# Patient Record
Sex: Male | Born: 1960 | Race: White | Hispanic: No | Marital: Single | Smoking: Current every day smoker
Health system: Southern US, Community
[De-identification: ages and names within clinical notes are randomized; demographics above are authoritative.]

## PROBLEM LIST (undated history)

## (undated) DIAGNOSIS — F329 Major depressive disorder, single episode, unspecified: Secondary | ICD-10-CM

## (undated) DIAGNOSIS — F17200 Nicotine dependence, unspecified, uncomplicated: Secondary | ICD-10-CM

## (undated) DIAGNOSIS — M1990 Unspecified osteoarthritis, unspecified site: Secondary | ICD-10-CM

## (undated) DIAGNOSIS — R0600 Dyspnea, unspecified: Secondary | ICD-10-CM

## (undated) DIAGNOSIS — Z8614 Personal history of Methicillin resistant Staphylococcus aureus infection: Secondary | ICD-10-CM

## (undated) DIAGNOSIS — F32A Depression, unspecified: Secondary | ICD-10-CM

## (undated) DIAGNOSIS — I10 Essential (primary) hypertension: Secondary | ICD-10-CM

## (undated) HISTORY — PX: TONSILLECTOMY: SUR1361

---
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
## 1997-02-03 HISTORY — PX: ANTERIOR CRUCIATE LIGAMENT REPAIR: SHX115

## 2004-07-23 ENCOUNTER — Ambulatory Visit: Payer: Self-pay

## 2004-08-19 ENCOUNTER — Ambulatory Visit: Payer: Self-pay | Admitting: Orthopaedic Surgery

## 2006-10-19 ENCOUNTER — Ambulatory Visit: Payer: Self-pay | Admitting: Otolaryngology

## 2007-07-15 ENCOUNTER — Emergency Department: Payer: Self-pay | Admitting: Emergency Medicine

## 2010-05-17 ENCOUNTER — Ambulatory Visit: Payer: Self-pay | Admitting: Gastroenterology

## 2012-03-24 ENCOUNTER — Ambulatory Visit: Payer: Self-pay | Admitting: Unknown Physician Specialty

## 2012-04-23 ENCOUNTER — Ambulatory Visit: Payer: Self-pay | Admitting: Unknown Physician Specialty

## 2013-09-16 DIAGNOSIS — F32A Depression, unspecified: Secondary | ICD-10-CM | POA: Insufficient documentation

## 2013-09-16 DIAGNOSIS — J302 Other seasonal allergic rhinitis: Secondary | ICD-10-CM | POA: Insufficient documentation

## 2013-09-16 DIAGNOSIS — F419 Anxiety disorder, unspecified: Secondary | ICD-10-CM | POA: Insufficient documentation

## 2014-02-08 DIAGNOSIS — E785 Hyperlipidemia, unspecified: Secondary | ICD-10-CM | POA: Insufficient documentation

## 2016-01-15 ENCOUNTER — Other Ambulatory Visit: Payer: Self-pay | Admitting: Otolaryngology

## 2016-01-15 DIAGNOSIS — H90A21 Sensorineural hearing loss, unilateral, right ear, with restricted hearing on the contralateral side: Secondary | ICD-10-CM

## 2016-01-30 ENCOUNTER — Ambulatory Visit
Admission: RE | Admit: 2016-01-30 | Discharge: 2016-01-30 | Disposition: A | Discharge status: Home / Self Care | Payer: Managed Care, Other (non HMO) | Source: Ambulatory Visit | Attending: Otolaryngology | Admitting: Otolaryngology

## 2016-01-30 DIAGNOSIS — Z8673 Personal history of transient ischemic attack (TIA), and cerebral infarction without residual deficits: Secondary | ICD-10-CM | POA: Insufficient documentation

## 2016-01-30 DIAGNOSIS — H90A21 Sensorineural hearing loss, unilateral, right ear, with restricted hearing on the contralateral side: Secondary | ICD-10-CM | POA: Diagnosis not present

## 2016-01-30 MED ORDER — GADOBENATE DIMEGLUMINE 529 MG/ML IV SOLN
17.0000 mL | Freq: Once | INTRAVENOUS | Status: AC | PRN
  Administered 2016-01-30: 17 mL via INTRAVENOUS

## 2016-02-04 HISTORY — PX: OTHER SURGICAL HISTORY: SHX169

## 2017-01-06 ENCOUNTER — Other Ambulatory Visit: Payer: Self-pay | Admitting: Podiatry

## 2017-01-06 ENCOUNTER — Encounter
Admission: RE | Admit: 2017-01-06 | Discharge: 2017-01-06 | Disposition: A | Discharge status: Home / Self Care | Payer: 59 | Source: Ambulatory Visit | Attending: Podiatry | Admitting: Podiatry

## 2017-01-06 ENCOUNTER — Other Ambulatory Visit: Payer: Self-pay

## 2017-01-06 DIAGNOSIS — F17210 Nicotine dependence, cigarettes, uncomplicated: Secondary | ICD-10-CM | POA: Diagnosis not present

## 2017-01-06 DIAGNOSIS — X58XXXA Exposure to other specified factors, initial encounter: Secondary | ICD-10-CM | POA: Diagnosis not present

## 2017-01-06 DIAGNOSIS — S96812A Strain of other specified muscles and tendons at ankle and foot level, left foot, initial encounter: Secondary | ICD-10-CM | POA: Diagnosis not present

## 2017-01-06 HISTORY — DX: Unspecified osteoarthritis, unspecified site: M19.90

## 2017-01-06 LAB — SURGICAL PCR SCREEN
MRSA, PCR: NEGATIVE
Staphylococcus aureus: POSITIVE — AB

## 2017-01-06 NOTE — Pre-Procedure Instructions (Signed)
Incentive Spirometry given to pt along with written and verbal instructions, pt returned correct demo of same.

## 2017-01-06 NOTE — Patient Instructions (Signed)
  Your procedure is scheduled ZO:XWRUEAon:Friday Dec. 7th , 2018. Report to Same Day Surgery. To find out your arrival time please call [phone] between 1PM - 3PM on Thursday Dec. 6, 2018 .  Remember: Instructions that are not followed completely may result in serious medical risk, up to and including death, or upon the discretion of your surgeon and anesthesiologist your surgery may need to be rescheduled.    _x___ 1. Do not eat food after midnight night prior to surgery. No gum   chewing or hard candies, snacks or breakfast.    May drink the following: water, Gatorade, clear apple juice, black coffee     or black tea up until 2 hours prior to ARRIVAL time.     ____ 2. No Alcohol for 24 hours before or after surgery.   ____ 3. Bring all medications with you on the day of surgery if instructed.    __x__ 4. Notify your doctor if there is any change in your medical condition     (cold, fever, infections).    _x____ 5.   Do Not Smoke or use e-cigarettes For 24 Hours Prior to Your   Surgery.  Do not use any chewable tobacco products for at least 6   hours prior to  surgery.                      Do not wear jewelry, make-up, hairpins, clips or nail polish.  Do not wear lotions, powders, or perfumes.   Do not shave 48 hours prior to surgery. Men may shave face and neck.  Do not bring valuables to the hospital.    [facility] is not responsible for any belongings or valuables.               Contacts, dentures or bridgework may not be worn into surgery.  Leave your suitcase in the car. After surgery it may be brought to your room.  For patients admitted to the hospital, discharge time is determined by your  treatment team.   Patients discharged the day of surgery will not be allowed to drive home.    Please read over the following fact sheets that you were given:   [facility] Pre[facility]  ____ Take these medicines the morning of surgery with A SIP OF WATER: none      ____  Fleet Enema (as directed)   __x__ Use CHG Soap as directed on instruction sheet  ____ Use inhalers on the day of surgery and bring to hospital day of surgery  ____ Stop metformin 2 days prior to surgery    ____ Take 1/2 of usual insulin dose the night before surgery and none on the morning of          surgery.   ____ Stop Eliquis/Coumadin/Plavix/aspirin on does not apply.  __x__ Stop Anti-inflammatories such as Advil, Aleve, Ibuprofen, Motrin, Naproxen,  Naprosyn, Goodies powders or aspirin products. OK to take Tylenol.   ____ Stop supplements until after surgery.    ____ Bring C-Pap to the hospital.

## 2017-01-06 NOTE — Pre-Procedure Instructions (Signed)
Nasal swab results: MRSA negative, Staph Aureus positive faxed to Dr. Irene LimboFowler's office along with request for H+P.  Status on fax  OK, receptionist reports that the fax was received.

## 2017-01-08 MED ORDER — CEFAZOLIN SODIUM-DEXTROSE 2-4 GM/100ML-% IV SOLN
2.0000 g | INTRAVENOUS | Status: AC
  Administered 2017-01-09: 2 g via INTRAVENOUS

## 2017-01-09 ENCOUNTER — Encounter
Admission: RE | Disposition: A | Discharge status: Home / Self Care | Payer: Self-pay | Source: Ambulatory Visit | Attending: Podiatry

## 2017-01-09 ENCOUNTER — Ambulatory Visit: Payer: 59 | Admitting: Anesthesiology

## 2017-01-09 ENCOUNTER — Encounter: Payer: Self-pay | Admitting: Podiatry

## 2017-01-09 ENCOUNTER — Ambulatory Visit
Admission: RE | Admit: 2017-01-09 | Discharge: 2017-01-09 | Disposition: A | Discharge status: Home / Self Care | Payer: 59 | Source: Ambulatory Visit | Attending: Podiatry | Admitting: Podiatry

## 2017-01-09 DIAGNOSIS — S96812A Strain of other specified muscles and tendons at ankle and foot level, left foot, initial encounter: Secondary | ICD-10-CM | POA: Insufficient documentation

## 2017-01-09 DIAGNOSIS — X58XXXA Exposure to other specified factors, initial encounter: Secondary | ICD-10-CM | POA: Insufficient documentation

## 2017-01-09 DIAGNOSIS — F17210 Nicotine dependence, cigarettes, uncomplicated: Secondary | ICD-10-CM | POA: Insufficient documentation

## 2017-01-09 HISTORY — PX: TENDON REPAIR: SHX5111

## 2017-01-09 LAB — GLUCOSE, CAPILLARY: Glucose-Capillary: 124 mg/dL — ABNORMAL HIGH (ref 65–99)

## 2017-01-09 SURGERY — TENDON REPAIR
Anesthesia: General | Site: Foot | Laterality: Left | Wound class: Clean

## 2017-01-09 MED ORDER — PROPOFOL 500 MG/50ML IV EMUL
INTRAVENOUS | Status: AC
  Filled 2017-01-09: qty 50

## 2017-01-09 MED ORDER — FAMOTIDINE 20 MG PO TABS
ORAL_TABLET | ORAL | Status: AC
  Filled 2017-01-09: qty 1

## 2017-01-09 MED ORDER — OXYCODONE-ACETAMINOPHEN 5-325 MG PO TABS
ORAL_TABLET | ORAL | Status: AC
  Filled 2017-01-09: qty 1

## 2017-01-09 MED ORDER — OXYCODONE-ACETAMINOPHEN 5-325 MG PO TABS
1.0000 | ORAL_TABLET | ORAL | Status: DC | PRN
  Administered 2017-01-09: 1 via ORAL

## 2017-01-09 MED ORDER — BUPIVACAINE HCL (PF) 0.5 % IJ SOLN
INTRAMUSCULAR | Status: AC
  Filled 2017-01-09: qty 30

## 2017-01-09 MED ORDER — ONDANSETRON HCL 4 MG/2ML IJ SOLN: 4.0000 mg | Freq: Once | INTRAMUSCULAR | Status: DC | PRN

## 2017-01-09 MED ORDER — DEXAMETHASONE SODIUM PHOSPHATE 10 MG/ML IJ SOLN
INTRAMUSCULAR | Status: AC
  Filled 2017-01-09: qty 1

## 2017-01-09 MED ORDER — FENTANYL CITRATE (PF) 100 MCG/2ML IJ SOLN
INTRAMUSCULAR | Status: DC | PRN
  Administered 2017-01-09 (×4): 50 ug via INTRAVENOUS

## 2017-01-09 MED ORDER — BUPIVACAINE HCL (PF) 0.25 % IJ SOLN
INTRAMUSCULAR | Status: AC
  Filled 2017-01-09: qty 20

## 2017-01-09 MED ORDER — FENTANYL CITRATE (PF) 100 MCG/2ML IJ SOLN
INTRAMUSCULAR | Status: AC
  Filled 2017-01-09: qty 2

## 2017-01-09 MED ORDER — POVIDONE-IODINE 7.5 % EX SOLN
Freq: Once | CUTANEOUS | Status: DC
  Filled 2017-01-09: qty 118

## 2017-01-09 MED ORDER — LIDOCAINE 2% (20 MG/ML) 5 ML SYRINGE
INTRAMUSCULAR | Status: DC | PRN
  Administered 2017-01-09: 100 mg via INTRAVENOUS

## 2017-01-09 MED ORDER — NEOMYCIN-POLYMYXIN B GU 40-200000 IR SOLN
Status: AC
  Filled 2017-01-09: qty 2

## 2017-01-09 MED ORDER — DEXMEDETOMIDINE HCL 200 MCG/2ML IV SOLN
INTRAVENOUS | Status: DC | PRN
  Administered 2017-01-09: 8 ug via INTRAVENOUS
  Administered 2017-01-09: 4 ug via INTRAVENOUS

## 2017-01-09 MED ORDER — DEXAMETHASONE SODIUM PHOSPHATE 10 MG/ML IJ SOLN
INTRAMUSCULAR | Status: DC | PRN
Start: 2017-01-09 — End: 2017-01-09
  Administered 2017-01-09: 10 mg via INTRAVENOUS

## 2017-01-09 MED ORDER — FAMOTIDINE 20 MG PO TABS
20.0000 mg | ORAL_TABLET | Freq: Once | ORAL | Status: AC
  Administered 2017-01-09: 20 mg via ORAL

## 2017-01-09 MED ORDER — FENTANYL CITRATE (PF) 100 MCG/2ML IJ SOLN
INTRAMUSCULAR | Status: AC
  Administered 2017-01-09: 25 ug via INTRAVENOUS
  Filled 2017-01-09: qty 2

## 2017-01-09 MED ORDER — ONDANSETRON HCL 4 MG/2ML IJ SOLN
INTRAMUSCULAR | Status: DC | PRN
  Administered 2017-01-09: 4 mg via INTRAVENOUS

## 2017-01-09 MED ORDER — PHENYLEPHRINE HCL 10 MG/ML IJ SOLN
INTRAMUSCULAR | Status: DC | PRN
Start: 2017-01-09 — End: 2017-01-09
  Administered 2017-01-09: 100 ug via INTRAVENOUS

## 2017-01-09 MED ORDER — FENTANYL CITRATE (PF) 100 MCG/2ML IJ SOLN
25.0000 ug | INTRAMUSCULAR | Status: DC | PRN
  Administered 2017-01-09 (×2): 25 ug via INTRAVENOUS

## 2017-01-09 MED ORDER — GLYCOPYRROLATE 0.2 MG/ML IJ SOLN
INTRAMUSCULAR | Status: AC
  Filled 2017-01-09: qty 1

## 2017-01-09 MED ORDER — MIDAZOLAM HCL 5 MG/5ML IJ SOLN
INTRAMUSCULAR | Status: DC | PRN
  Administered 2017-01-09: 2 mg via INTRAVENOUS

## 2017-01-09 MED ORDER — BUPIVACAINE LIPOSOME 1.3 % IJ SUSP
INTRAMUSCULAR | Status: AC
  Filled 2017-01-09: qty 20

## 2017-01-09 MED ORDER — BUPIVACAINE HCL 0.25 % IJ SOLN
INTRAMUSCULAR | Status: DC | PRN
  Administered 2017-01-09: 12 mL

## 2017-01-09 MED ORDER — LIDOCAINE HCL (PF) 1 % IJ SOLN
INTRAMUSCULAR | Status: AC
  Filled 2017-01-09: qty 30

## 2017-01-09 MED ORDER — LIDOCAINE HCL (PF) 2 % IJ SOLN
INTRAMUSCULAR | Status: AC
  Filled 2017-01-09: qty 10

## 2017-01-09 MED ORDER — MIDAZOLAM HCL 2 MG/2ML IJ SOLN
INTRAMUSCULAR | Status: AC
  Filled 2017-01-09: qty 2

## 2017-01-09 MED ORDER — LACTATED RINGERS IV SOLN
INTRAVENOUS | Status: DC
  Administered 2017-01-09 (×2): via INTRAVENOUS

## 2017-01-09 MED ORDER — ONDANSETRON HCL 4 MG/2ML IJ SOLN
INTRAMUSCULAR | Status: AC
  Filled 2017-01-09: qty 2

## 2017-01-09 MED ORDER — ALBUTEROL SULFATE HFA 108 (90 BASE) MCG/ACT IN AERS
INHALATION_SPRAY | RESPIRATORY_TRACT | Status: DC | PRN
  Administered 2017-01-09: 4 via RESPIRATORY_TRACT

## 2017-01-09 MED ORDER — CEFAZOLIN SODIUM-DEXTROSE 2-4 GM/100ML-% IV SOLN
INTRAVENOUS | Status: AC
  Filled 2017-01-09: qty 100

## 2017-01-09 MED ORDER — PROPOFOL 10 MG/ML IV BOLUS
INTRAVENOUS | Status: DC | PRN
  Administered 2017-01-09: 100 mg via INTRAVENOUS
  Administered 2017-01-09: 200 mg via INTRAVENOUS

## 2017-01-09 MED ORDER — BUPIVACAINE LIPOSOME 1.3 % IJ SUSP
INTRAMUSCULAR | Status: DC | PRN
  Administered 2017-01-09: 12 mL

## 2017-01-09 MED ORDER — OXYCODONE-ACETAMINOPHEN 5-325 MG PO TABS: 1.0000 | ORAL_TABLET | ORAL | 0 refills | Status: DC | PRN

## 2017-01-09 SURGICAL SUPPLY — 59 items
ANCHOR SUT KNTLS SPEEDLOCK (Anchor) ×2 IMPLANT
BANDAGE ACE 4X5 VEL STRL LF (GAUZE/BANDAGES/DRESSINGS) ×2 IMPLANT
BANDAGE ELASTIC 4 LF NS (GAUZE/BANDAGES/DRESSINGS) IMPLANT
BANDAGE STRETCH 3X4.1 STRL (GAUZE/BANDAGES/DRESSINGS) ×2 IMPLANT
BLADE SURG 15 STRL LF DISP TIS (BLADE) ×1 IMPLANT
BLADE SURG 15 STRL SS (BLADE) ×1
BLADE SURG MINI STRL (BLADE) ×2 IMPLANT
BNDG COHESIVE 4X5 TAN STRL (GAUZE/BANDAGES/DRESSINGS) ×2 IMPLANT
BNDG ESMARK 4X12 TAN STRL LF (GAUZE/BANDAGES/DRESSINGS) ×4 IMPLANT
CANISTER SUCT 1200ML W/VALVE (MISCELLANEOUS) ×2 IMPLANT
DRAPE FLUOR MINI C-ARM 54X84 (DRAPES) ×2 IMPLANT
DURAPREP 26ML APPLICATOR (WOUND CARE) ×2 IMPLANT
ELECT REM PT RETURN 9FT ADLT (ELECTROSURGICAL) ×2
ELECTRODE REM PT RTRN 9FT ADLT (ELECTROSURGICAL) ×1 IMPLANT
GAUZE PETRO XEROFOAM 1X8 (MISCELLANEOUS) IMPLANT
GAUZE SPONGE 4X4 12PLY STRL (GAUZE/BANDAGES/DRESSINGS) ×2 IMPLANT
GAUZE STRETCH 2X75IN STRL (MISCELLANEOUS) IMPLANT
GLOVE BIO SURGEON STRL SZ7.5 (GLOVE) ×2 IMPLANT
GLOVE INDICATOR 8.0 STRL GRN (GLOVE) ×2 IMPLANT
GOWN STRL REUS W/ TWL LRG LVL3 (GOWN DISPOSABLE) ×2 IMPLANT
GOWN STRL REUS W/TWL LRG LVL3 (GOWN DISPOSABLE) ×2
HANDLE YANKAUER SUCT BULB TIP (MISCELLANEOUS) ×2 IMPLANT
KIT RM TURNOVER STRD PROC AR (KITS) ×2 IMPLANT
KIT SUTURE 1.8 Q-FIX DISP (KITS) IMPLANT
LABEL OR SOLS (LABEL) IMPLANT
NDL MAYO CATGUT SZ5 (NEEDLE)
NDL SUT 5 .5 CRC TPR PNT MAYO (NEEDLE) IMPLANT
NEEDLE FILTER BLUNT 18X 1/2SAF (NEEDLE)
NEEDLE FILTER BLUNT 18X1 1/2 (NEEDLE) IMPLANT
NEEDLE HYPO 25X1 1.5 SAFETY (NEEDLE) ×2 IMPLANT
NS IRRIG 500ML POUR BTL (IV SOLUTION) ×2 IMPLANT
PACK EXTREMITY ARMC (MISCELLANEOUS) ×2 IMPLANT
PAD CAST CTTN 4X4 STRL (SOFTGOODS) IMPLANT
PADDING CAST COTTON 4X4 STRL (SOFTGOODS)
RASP SM TEAR CROSS CUT (RASP) IMPLANT
SOL PREP PVP 2OZ (MISCELLANEOUS)
SOLUTION PREP PVP 2OZ (MISCELLANEOUS) IMPLANT
SPLINT CAST 1 STEP 5X30 WHT (MISCELLANEOUS) IMPLANT
SPLINT FAST PLASTER 5X30 (CAST SUPPLIES)
SPLINT PLASTER CAST FAST 5X30 (CAST SUPPLIES) IMPLANT
SPONGE LAP 18X18 5 PK (GAUZE/BANDAGES/DRESSINGS) IMPLANT
STOCKINETTE M/LG 89821 (MISCELLANEOUS) ×2 IMPLANT
STRIP CLOSURE SKIN 1/2X4 (GAUZE/BANDAGES/DRESSINGS) IMPLANT
SUT MNCRL+ 5-0 VIOLET P-3 (SUTURE) IMPLANT
SUT MONOCRYL 5-0 (SUTURE)
SUT VIC AB 0 SH 27 (SUTURE) ×2 IMPLANT
SUT VIC AB 2-0 SH 27 (SUTURE) ×1
SUT VIC AB 2-0 SH 27XBRD (SUTURE) ×1 IMPLANT
SUT VIC AB 3-0 54X BRD REEL (SUTURE) ×1 IMPLANT
SUT VIC AB 3-0 BRD 54 (SUTURE) ×1
SUT VIC AB 3-0 SH 27 (SUTURE) ×1
SUT VIC AB 3-0 SH 27X BRD (SUTURE) ×1 IMPLANT
SUT VIC AB 4-0 FS2 27 (SUTURE) IMPLANT
SUT VICRYL AB 3-0 FS1 BRD 27IN (SUTURE) IMPLANT
SWABSTK COMLB BENZOIN TINCTURE (MISCELLANEOUS) IMPLANT
SYR 10ML LL (SYRINGE) ×2 IMPLANT
SYR 3ML LL SCALE MARK (SYRINGE) IMPLANT
SYR BULB IRRIG 60ML STRL (SYRINGE) ×2 IMPLANT
WIRE MAGNUM (SUTURE) ×2 IMPLANT

## 2017-01-09 NOTE — H&P (Signed)
HISTORY AND PHYSICAL INTERVAL NOTE:  01/09/2017  10:58 AM  Leretha DykesArthur W Leone III  has presented today for surgery, with the diagnosis of Tear of tibialis anterior tendon.  The various methods of treatment have been discussed with the patient.  No guarantees were given.  After consideration of risks, benefits and other options for treatment, the patient has consented to surgery.  I have reviewed the patients' chart and labs.    Patient Vitals for the past 24 hrs:  BP Temp Temp src Pulse Resp SpO2 Height Weight  01/09/17 0909 (!) 140/104 97.7 F (36.5 C) Oral 65 17 97 % 5\' 9"  (1.753 m) 81.6 kg (180 lb)    A history and physical examination was performed in my office.  The patient was reexamined.  There have been no changes to this history and physical examination.  Gwyneth RevelsFowler, Chadwin Fury A

## 2017-01-09 NOTE — Op Note (Signed)
Operative note   Surgeon:Tyreesha Maharaj [employer] forces logistics/support/administrative officerowler    Assistant: None    Preop diagnosis: Anterior tibial tendon tear left anterior ankle    Postop diagnosis: Same    Procedure: Open repair primary, anterior tibial tendon repair    EBL: Minimal    Anesthesia:local and general.  Local consisted of a 50-50 mixture of 0.25% bupivacaine and Exparel long-acting anesthetic.  A total of 24 cc was used post operatively.    Hemostasis: Thigh tourniquet inflated to 250 mmHg for approximately 1 hour    Specimen: Torn anterior tibial tendon left ankle    Complications: None    Operative indications:Dean Dominguez is an 56 y.o. that presents today for surgical intervention.  The risks/benefits/alternatives/complications have been discussed and consent has been given.    Procedure:  Patient was brought into the OR and placed on the operating table in thesupine position. After anesthesia was obtained theleft lower extremity was prepped and draped in usual sterile fashion.  Attention was directed to the anterior aspect of the ankle where there was a palpable knot noted.  An incision was made at the level of the ankle joint extending distally to the medial cuneiform.  Incision was taken down to the tendon sheath.  This was entered and a full-thickness rupture off of the medial cuneiform of the anterior tibial tendon was noted.  Small fraying of the distal aspect of the anterior tibial tendon was excised and sent for pathological examination.  At this point the tendon sheath was noted and entered.  The extensor retinaculum was noted to be intact over the midfoot.  The insertional site of the anterior tibial tendon was noted on the medial cuneiform.  A #2 Magnum wire was then used to weave through the anterior tibial tendon and a Krakw type of suture.  Next a [employer] Speed [employer] bone anchor was placed with standard technique into the medial cuneiform and the anterior tibial tendon was tensioned against the  medial cuneiform.  The foot was held in a neutral 90 degree position.  Good stability was noted.  The tendon had been taken underneath the extensor retinaculum and preserved.  The wound was then flushed with copious amounts of irrigation.  Layered closure was performed with a 3-0 Vicryl for the tendon sheath.  With 3-0 Vicryl the subtenons tissue and a 3-0 nylon for skin.  The postoperative anesthetic local block was performed around the surgical site with 0.25% bupivacaine and Exparel long-acting anesthetic.  He was then placed in a well compressive sterile dressing and placed in an equalizer walker boot at 90 degrees in neutral position.    Patient tolerated the procedure and anesthesia well.  Was transported from the OR to the PACU with all vital signs stable and vascular status intact. To be discharged per routine protocol.  Will follow up in approximately 1 week in the outpatient clinic.

## 2017-01-09 NOTE — Anesthesia Post-op Follow-up Note (Signed)
Anesthesia QCDR form completed.        

## 2017-01-09 NOTE — Transfer of Care (Signed)
Immediate Anesthesia Transfer of Care Note  Patient: Dean Dominguez  Procedure(s) Performed: TENDON REPAIR-TIBIALIS TENDON (Left Foot)  Patient Location: PACU  Anesthesia Type:General  Level of Consciousness: awake, alert  and oriented  Airway & Oxygen Therapy: Patient Spontanous Breathing and Patient connected to face mask oxygen  Post-op Assessment: Report given to RN  Post vital signs: Reviewed and stable  Last Vitals:  Vitals:   01/09/17 0909  BP: (!) 140/104  Pulse: 65  Resp: 17  Temp: 36.5 C  SpO2: 97%    Last Pain:  Vitals:   01/09/17 0909  TempSrc: Oral         Complications: No apparent anesthesia complications

## 2017-01-09 NOTE — Anesthesia Procedure Notes (Signed)
Procedure Name: LMA Insertion Date/Time: 01/09/2017 12:07 PM Performed by: Paulette BlanchParas, Marinda Tyer, CRNA Pre-anesthesia Checklist: Patient identified, Patient being monitored, Timeout performed, Emergency Drugs available and Suction available Patient Re-evaluated:Patient Re-evaluated prior to induction Oxygen Delivery Method: Circle system utilized Preoxygenation: Pre-oxygenation with 100% oxygen Induction Type: IV induction Ventilation: Mask ventilation without difficulty LMA: LMA inserted LMA Size: 4.5 Tube type: Oral Number of attempts: 2 Placement Confirmation: positive ETCO2 and breath sounds checked- equal and bilateral Tube secured with: Tape Dental Injury: Teeth and Oropharynx as per pre-operative assessment

## 2017-01-09 NOTE — Discharge Instructions (Signed)
°  AMBULATORY SURGERY  DISCHARGE INSTRUCTIONS   1) The drugs that you were given will stay in your system until tomorrow so for the next 24 hours you should not:  A) Drive an automobile B) Make any legal decisions C) Drink any alcoholic beverage   2) You may resume regular meals tomorrow.  Today it is better to start with liquids and gradually work up to solid foods.  You may eat anything you prefer, but it is better to start with liquids, then soup and crackers, and gradually work up to solid foods.   3) Please notify your doctor immediately if you have any unusual bleeding, trouble breathing, redness and pain at the surgery site, drainage, fever, or pain not relieved by medication.    4) Additional Instructions:        Please contact your physician with any problems or Same Day Surgery at [phone], Monday through Friday 6 am to 4 pm, or [place] at [facility] Main number at [phone].  [facility]  POST OPERATIVE INSTRUCTIONS FOR DR. TROXLER AND DR. [facility] POD[facility]   1. Take your medication as prescribed.  Pain medication should be taken only as needed.  2. Keep the dressing clean, dry and intact.  3. Keep your foot elevated above the heart level for the first 48 hours.  4. Walking to the bathroom and brief periods of walking are acceptable, unless we have instructed you to be non-weight bearing.  5. Always wear your post-op shoe when walking.  Always use your crutches if you are to be non-weight bearing.  6. Do not take a shower. Baths are permissible as long as the foot is kept out of the water.   7. Every hour you are awake:  - Bend your knee 15 times. - Flex foot 15 times - Massage calf 15 times  8. Call [facility] [phone]) if any of the following problems occur: - You develop a temperature or fever. - The bandage becomes saturated with blood. - Medication does not  stop your pain. - Injury of the foot occurs. - Any symptoms of infection including redness, odor, or red streaks running from wound.

## 2017-01-09 NOTE — Anesthesia Preprocedure Evaluation (Signed)
Anesthesia Evaluation  Patient identified by MRN, date of birth, ID band Patient awake    Reviewed: Allergy & Precautions, H&P , NPO status , Patient's Chart, lab work & pertinent test results, reviewed documented beta blocker date and time   Airway Mallampati: II  TM Distance: >3 FB Neck ROM: full    Dental  (+) Teeth Intact   Pulmonary neg pulmonary ROS, Current Smoker,    Pulmonary exam normal        Cardiovascular Exercise Tolerance: Good negative cardio ROS Normal cardiovascular exam Rate:Normal     Neuro/Psych negative neurological ROS  negative psych ROS   GI/Hepatic negative GI ROS, Neg liver ROS,   Endo/Other  negative endocrine ROSdiabetes  Renal/GU negative Renal ROS  negative genitourinary   Musculoskeletal   Abdominal   Peds  Hematology negative hematology ROS (+)   Anesthesia Other Findings   Reproductive/Obstetrics negative OB ROS                             Anesthesia Physical Anesthesia Plan  ASA: II  Anesthesia Plan: General LMA   Post-op Pain Management:    Induction:   PONV Risk Score and Plan:   Airway Management Planned:   Additional Equipment:   Intra-op Plan:   Post-operative Plan:   Informed Consent: I have reviewed the patients History and Physical, chart, labs and discussed the procedure including the risks, benefits and alternatives for the proposed anesthesia with the patient or authorized representative who has indicated his/her understanding and acceptance.     Plan Discussed with: CRNA  Anesthesia Plan Comments:         Anesthesia Quick Evaluation

## 2017-01-12 LAB — SURGICAL PATHOLOGY

## 2017-01-12 NOTE — Anesthesia Postprocedure Evaluation (Signed)
Anesthesia Post Note  Patient: Dean Dominguez  Procedure(s) Performed: TENDON REPAIR-TIBIALIS TENDON (Left Foot)  Patient location during evaluation: PACU Anesthesia Type: General Level of consciousness: awake and alert Pain management: pain level controlled Vital Signs Assessment: post-procedure vital signs reviewed and stable Respiratory status: spontaneous breathing, nonlabored ventilation, respiratory function stable and patient connected to nasal cannula oxygen Cardiovascular status: blood pressure returned to baseline and stable Postop Assessment: no apparent nausea or vomiting Anesthetic complications: no     Last Vitals:  Vitals:   01/09/17 1421 01/09/17 1454  BP: 127/80 118/89  Pulse: (!) 57 65  Resp: 16 16  Temp: (!) 36.2 C   SpO2: 96% 98%    Last Pain:  Vitals:   01/09/17 1454  TempSrc:   PainSc: 3                  Yevette EdwardsJames G Maliya Marich

## 2017-04-21 ENCOUNTER — Encounter: Payer: Self-pay | Admitting: *Deleted

## 2017-04-29 ENCOUNTER — Ambulatory Visit: Payer: 59 | Admitting: Anesthesiology

## 2017-04-29 ENCOUNTER — Encounter
Admission: RE | Disposition: A | Discharge status: Home / Self Care | Payer: Self-pay | Source: Ambulatory Visit | Attending: Ophthalmology

## 2017-04-29 ENCOUNTER — Ambulatory Visit
Admission: RE | Admit: 2017-04-29 | Discharge: 2017-04-29 | Disposition: A | Discharge status: Home / Self Care | Payer: 59 | Source: Ambulatory Visit | Attending: Ophthalmology | Admitting: Ophthalmology

## 2017-04-29 DIAGNOSIS — Z79899 Other long term (current) drug therapy: Secondary | ICD-10-CM | POA: Insufficient documentation

## 2017-04-29 DIAGNOSIS — Z791 Long term (current) use of non-steroidal anti-inflammatories (NSAID): Secondary | ICD-10-CM | POA: Diagnosis not present

## 2017-04-29 DIAGNOSIS — H2512 Age-related nuclear cataract, left eye: Secondary | ICD-10-CM | POA: Diagnosis not present

## 2017-04-29 DIAGNOSIS — F17200 Nicotine dependence, unspecified, uncomplicated: Secondary | ICD-10-CM | POA: Diagnosis not present

## 2017-04-29 HISTORY — DX: Major depressive disorder, single episode, unspecified: F32.9

## 2017-04-29 HISTORY — PX: CATARACT EXTRACTION W/PHACO: SHX586

## 2017-04-29 HISTORY — DX: Depression, unspecified: F32.A

## 2017-04-29 HISTORY — DX: Dyspnea, unspecified: R06.00

## 2017-04-29 LAB — URINE DRUG SCREEN, QUALITATIVE (ARMC ONLY)
AMPHETAMINES, UR SCREEN: NOT DETECTED
BARBITURATES, UR SCREEN: NOT DETECTED
Benzodiazepine, Ur Scrn: NOT DETECTED
COCAINE METABOLITE, UR ~~LOC~~: NOT DETECTED
Cannabinoid 50 Ng, Ur ~~LOC~~: POSITIVE — AB
MDMA (Ecstasy)Ur Screen: NOT DETECTED
METHADONE SCREEN, URINE: NOT DETECTED
OPIATE, UR SCREEN: NOT DETECTED
PHENCYCLIDINE (PCP) UR S: NOT DETECTED
Tricyclic, Ur Screen: NOT DETECTED

## 2017-04-29 SURGERY — PHACOEMULSIFICATION, CATARACT, WITH IOL INSERTION
Anesthesia: General | Site: Eye | Laterality: Left | Wound class: "Clean "

## 2017-04-29 MED ORDER — ARMC OPHTHALMIC DILATING DROPS
OPHTHALMIC | Status: AC
  Administered 2017-04-29: 1 via OPHTHALMIC
  Filled 2017-04-29: qty 0.4

## 2017-04-29 MED ORDER — GLYCOPYRROLATE 0.2 MG/ML IJ SOLN
INTRAMUSCULAR | Status: AC
  Filled 2017-04-29: qty 1

## 2017-04-29 MED ORDER — MIDAZOLAM HCL 2 MG/2ML IJ SOLN
INTRAMUSCULAR | Status: DC | PRN
  Administered 2017-04-29: 2 mg via INTRAVENOUS

## 2017-04-29 MED ORDER — LIDOCAINE HCL (PF) 4 % IJ SOLN
INTRAMUSCULAR | Status: AC
  Filled 2017-04-29: qty 5

## 2017-04-29 MED ORDER — NA CHONDROIT SULF-NA HYALURON 40-17 MG/ML IO SOLN
INTRAOCULAR | Status: DC | PRN
  Administered 2017-04-29: 1 mL via INTRAOCULAR

## 2017-04-29 MED ORDER — MOXIFLOXACIN HCL 0.5 % OP SOLN
OPHTHALMIC | Status: DC | PRN
  Administered 2017-04-29: .2 mL via OPHTHALMIC

## 2017-04-29 MED ORDER — ARMC OPHTHALMIC DILATING DROPS
1.0000 "application " | OPHTHALMIC | Status: AC
  Administered 2017-04-29 (×3): 1 via OPHTHALMIC

## 2017-04-29 MED ORDER — CARBACHOL 0.01 % IO SOLN
INTRAOCULAR | Status: DC | PRN
  Administered 2017-04-29: .5 mL via INTRAOCULAR

## 2017-04-29 MED ORDER — NA CHONDROIT SULF-NA HYALURON 40-17 MG/ML IO SOLN
INTRAOCULAR | Status: AC
  Filled 2017-04-29: qty 1

## 2017-04-29 MED ORDER — EPINEPHRINE PF 1 MG/ML IJ SOLN
INTRAOCULAR | Status: DC | PRN
  Administered 2017-04-29: 1 mL via OPHTHALMIC

## 2017-04-29 MED ORDER — POVIDONE-IODINE 5 % OP SOLN
OPHTHALMIC | Status: AC
  Filled 2017-04-29: qty 30

## 2017-04-29 MED ORDER — POVIDONE-IODINE 5 % OP SOLN
OPHTHALMIC | Status: DC | PRN
  Administered 2017-04-29: 1 via OPHTHALMIC

## 2017-04-29 MED ORDER — LIDOCAINE HCL (PF) 4 % IJ SOLN
INTRAOCULAR | Status: DC | PRN
  Administered 2017-04-29: 2 mL via OPHTHALMIC

## 2017-04-29 MED ORDER — FENTANYL CITRATE (PF) 100 MCG/2ML IJ SOLN: INTRAMUSCULAR | Status: DC | PRN

## 2017-04-29 MED ORDER — GLYCOPYRROLATE 0.2 MG/ML IJ SOLN
INTRAMUSCULAR | Status: DC | PRN
  Administered 2017-04-29: 0.1 mg via INTRAVENOUS

## 2017-04-29 MED ORDER — SODIUM CHLORIDE 0.9 % IV SOLN
INTRAVENOUS | Status: DC
  Administered 2017-04-29: 12:00:00 via INTRAVENOUS

## 2017-04-29 MED ORDER — HYDROMORPHONE HCL 1 MG/ML IJ SOLN
INTRAMUSCULAR | Status: DC | PRN
  Administered 2017-04-29 (×2): 0.5 mg via INTRAVENOUS

## 2017-04-29 MED ORDER — MOXIFLOXACIN HCL 0.5 % OP SOLN: 1.0000 [drp] | OPHTHALMIC | Status: DC | PRN

## 2017-04-29 MED ORDER — MOXIFLOXACIN HCL 0.5 % OP SOLN
OPHTHALMIC | Status: AC
  Filled 2017-04-29: qty 3

## 2017-04-29 MED ORDER — HYDROMORPHONE HCL 1 MG/ML IJ SOLN
INTRAMUSCULAR | Status: AC
  Filled 2017-04-29: qty 1

## 2017-04-29 MED ORDER — EPINEPHRINE PF 1 MG/ML IJ SOLN
INTRAMUSCULAR | Status: AC
  Filled 2017-04-29: qty 1

## 2017-04-29 MED ORDER — MIDAZOLAM HCL 2 MG/2ML IJ SOLN
INTRAMUSCULAR | Status: AC
  Filled 2017-04-29: qty 2

## 2017-04-29 SURGICAL SUPPLY — 16 items

## 2017-04-29 NOTE — Anesthesia Postprocedure Evaluation (Signed)
Anesthesia Post Note  Patient: Dean Dominguez  Procedure(s) Performed: CATARACT EXTRACTION PHACO AND INTRAOCULAR LENS PLACEMENT (IOC) (Left Eye)  Patient location during evaluation: Endoscopy Anesthesia Type: General Level of consciousness: awake and alert Pain management: pain level controlled Vital Signs Assessment: post-procedure vital signs reviewed and stable Respiratory status: spontaneous breathing, nonlabored ventilation and respiratory function stable Cardiovascular status: blood pressure returned to baseline and stable Postop Assessment: no apparent nausea or vomiting Anesthetic complications: no     Last Vitals:  Vitals:   04/29/17 1307 04/29/17 1308  BP: 135/72 135/72  Pulse: (!) 56 (!) 57  Resp: 16 18  Temp: (!) 36.4 C (!) 36.4 C  SpO2: 95% 96%    Last Pain:  Vitals:   04/29/17 1308  TempSrc:   PainSc: 0-No pain                 Christia ReadingScott T Chanetta Moosman

## 2017-04-29 NOTE — Anesthesia Post-op Follow-up Note (Signed)
Anesthesia QCDR form completed.        

## 2017-04-29 NOTE — H&P (Signed)
All labs reviewed. Abnormal studies sent to patients PCP when indicated.  Previous H&P reviewed, patient examined, there are NO CHANGES.  Dean Gasaway Porfilio3/27/201912:40 PM

## 2017-04-29 NOTE — Discharge Instructions (Signed)
Eye Surgery Discharge Instructions  Expect mild scratchy sensation or mild soreness. DO NOT RUB YOUR EYE!  The day of surgery:  Minimal physical activity, but bed rest is not required  No reading, computer work, or close hand work  No bending, lifting, or straining.  May watch TV  For 24 hours:  No driving, legal decisions, or alcoholic beverages  Safety precautions  Eat anything you prefer: It is better to start with liquids, then soup then solid foods.  _____ Eye patch should be worn until postoperative exam tomorrow.  ____ Solar shield eyeglasses should be worn for comfort in the sunlight/patch while sleeping  Resume all regular medications including aspirin or Coumadin if these were discontinued prior to surgery. You may shower, bathe, shave, or wash your hair. Tylenol may be taken for mild discomfort.  Call your doctor if you experience significant pain, nausea, or vomiting, fever > 101 or other signs of infection. [phone] or [phone] Specific instructions:  Follow-up Information    Galen ManilaPorfilio, William, MD Follow up.   Specialty:  Ophthalmology Why:  March 28 at 9:00am Contact information: [address] [place] [address] [phone]

## 2017-04-29 NOTE — Op Note (Signed)
PREOPERATIVE DIAGNOSIS:  Nuclear sclerotic cataract of the left eye.   POSTOPERATIVE DIAGNOSIS:  Nuclear sclerotic cataract of the left eye.   OPERATIVE PROCEDURE: Procedure(s): CATARACT EXTRACTION PHACO AND INTRAOCULAR LENS PLACEMENT (IOC)   SURGEON:  Tanise Russman, MD.   ANESTHESIA:  Anesthesiologist: ChristGalen Manilaia ReadingHowell, Scott T, MD CRNA: Sherol DadeMacMang, Josephine H, CRNA  1.      Managed anesthesia care. 2.     0.611ml of Shugarcaine was instilled following the paracentesis   COMPLICATIONS:  None.   TECHNIQUE:   Stop and chop   DESCRIPTION OF PROCEDURE:  The patient was examined and consented in the preoperative holding area where the aforementioned topical anesthesia was applied to the left eye and then brought back to the Operating Room where the left eye was prepped and draped in the usual sterile ophthalmic fashion and a lid speculum was placed. A paracentesis was created with the side port blade and the anterior chamber was filled with viscoelastic. A near clear corneal incision was performed with the steel keratome. A continuous curvilinear capsulorrhexis was performed with a cystotome followed by the capsulorrhexis forceps. Hydrodissection and hydrodelineation were carried out with BSS on a blunt cannula. The lens was removed in a stop and chop  technique and the remaining cortical material was removed with the irrigation-aspiration handpiece. The capsular bag was inflated with viscoelastic and the Technis [id number] lens was placed in the capsular bag without complication. The remaining viscoelastic was removed from the eye with the irrigation-aspiration handpiece. The wounds were hydrated. The anterior chamber was flushed with Miostat and the eye was inflated to physiologic pressure. 0.631ml Vigamox was placed in the anterior chamber. The wounds were found to be water tight. The eye was dressed with Vigamox. The patient was given protective glasses to wear throughout the day and a shield with which to sleep  tonight. The patient was also given drops with which to begin a drop regimen today and will follow-up with me in one day. Implant Name Type Inv. Item Serial No. Manufacturer Lot No. LRB No. Used  LENS IOL DIOP 21.0 - [id number] Intraocular Lens LENS IOL DIOP 21.0 [id number] AMO  Left 1    Procedure(s) with comments: CATARACT EXTRACTION PHACO AND INTRAOCULAR LENS PLACEMENT (IOC) (Left) - US 00:34 AP% 17.6 CDE 6.09 Fluid pak lot # [id number]  Electronically signed: Galen ManilaWilliam Amiria Orrison 04/29/2017 1:04 PM

## 2017-04-29 NOTE — Transfer of Care (Signed)
Immediate Anesthesia Transfer of Care Note  Patient: Dean Dominguez  Procedure(s) Performed: CATARACT EXTRACTION PHACO AND INTRAOCULAR LENS PLACEMENT (IOC) (Left Eye)  Patient Location: PACU and Short Stay  Anesthesia Type:General  Level of Consciousness: awake, alert , oriented and patient cooperative  Airway & Oxygen Therapy: Patient Spontanous Breathing  Post-op Assessment: Report given to RN, Post -op Vital signs reviewed and stable and Patient moving all extremities X 4  Post vital signs: Reviewed and stable  Last Vitals:  Vitals Value Taken Time  BP 135/72 04/29/2017  1:08 PM  Temp 36.4 C 04/29/2017  1:08 PM  Pulse 57 04/29/2017  1:08 PM  Resp 18 04/29/2017  1:08 PM  SpO2 96 % 04/29/2017  1:08 PM    Last Pain:  Vitals:   04/29/17 1308  TempSrc:   PainSc: 0-No pain         Complications: No apparent anesthesia complications

## 2017-04-29 NOTE — Anesthesia Preprocedure Evaluation (Signed)
Anesthesia Evaluation  Patient identified by MRN, date of birth, ID band Patient awake    Reviewed: Allergy & Precautions, H&P , NPO status , reviewed documented beta blocker date and time   Airway Mallampati: III  TM Distance: >3 FB     Dental  (+) Chipped   Pulmonary shortness of breath, Current Smoker,    Pulmonary exam normal        Cardiovascular negative cardio ROS Normal cardiovascular exam     Neuro/Psych PSYCHIATRIC DISORDERS Depression negative neurological ROS     GI/Hepatic Neg liver ROS, neg GERD  ,  Endo/Other  negative endocrine ROS  Renal/GU negative Renal ROS  negative genitourinary   Musculoskeletal  (+) Arthritis , Osteoarthritis,    Abdominal   Peds  Hematology negative hematology ROS (+)   Anesthesia Other Findings   Reproductive/Obstetrics                             Anesthesia Physical Anesthesia Plan  ASA: II  Anesthesia Plan: General   Post-op Pain Management:    Induction:   PONV Risk Score and Plan: 1 and TIVA and Midazolam  Airway Management Planned:   Additional Equipment:   Intra-op Plan:   Post-operative Plan:   Informed Consent: I have reviewed the patients History and Physical, chart, labs and discussed the procedure including the risks, benefits and alternatives for the proposed anesthesia with the patient or authorized representative who has indicated his/her understanding and acceptance.   Dental Advisory Given  Plan Discussed with: CRNA  Anesthesia Plan Comments:         Anesthesia Quick Evaluation

## 2017-05-13 ENCOUNTER — Encounter: Payer: Self-pay | Admitting: *Deleted

## 2017-05-19 ENCOUNTER — Encounter: Payer: Self-pay | Admitting: *Deleted

## 2017-05-19 ENCOUNTER — Encounter
Admission: RE | Disposition: A | Discharge status: Home / Self Care | Payer: Self-pay | Source: Ambulatory Visit | Attending: Ophthalmology

## 2017-05-19 ENCOUNTER — Ambulatory Visit: Payer: 59 | Admitting: Anesthesiology

## 2017-05-19 ENCOUNTER — Ambulatory Visit
Admission: RE | Admit: 2017-05-19 | Discharge: 2017-05-19 | Disposition: A | Discharge status: Home / Self Care | Payer: 59 | Source: Ambulatory Visit | Attending: Ophthalmology | Admitting: Ophthalmology

## 2017-05-19 ENCOUNTER — Other Ambulatory Visit: Payer: Self-pay

## 2017-05-19 DIAGNOSIS — F329 Major depressive disorder, single episode, unspecified: Secondary | ICD-10-CM | POA: Insufficient documentation

## 2017-05-19 DIAGNOSIS — Z79899 Other long term (current) drug therapy: Secondary | ICD-10-CM | POA: Insufficient documentation

## 2017-05-19 DIAGNOSIS — H2511 Age-related nuclear cataract, right eye: Secondary | ICD-10-CM | POA: Insufficient documentation

## 2017-05-19 DIAGNOSIS — M1990 Unspecified osteoarthritis, unspecified site: Secondary | ICD-10-CM | POA: Diagnosis not present

## 2017-05-19 HISTORY — PX: CATARACT EXTRACTION W/PHACO: SHX586

## 2017-05-19 SURGERY — PHACOEMULSIFICATION, CATARACT, WITH IOL INSERTION
Anesthesia: Monitor Anesthesia Care | Site: Eye | Laterality: Right | Wound class: "Clean "

## 2017-05-19 MED ORDER — MOXIFLOXACIN HCL 0.5 % OP SOLN
OPHTHALMIC | Status: AC
  Filled 2017-05-19: qty 3

## 2017-05-19 MED ORDER — ARMC OPHTHALMIC DILATING DROPS
OPHTHALMIC | Status: AC
  Administered 2017-05-19: 1 via OPHTHALMIC
  Filled 2017-05-19: qty 0.4

## 2017-05-19 MED ORDER — LIDOCAINE HCL (PF) 4 % IJ SOLN
INTRAMUSCULAR | Status: AC
  Filled 2017-05-19: qty 5

## 2017-05-19 MED ORDER — MIDAZOLAM HCL 2 MG/2ML IJ SOLN
INTRAMUSCULAR | Status: AC
  Filled 2017-05-19: qty 2

## 2017-05-19 MED ORDER — MOXIFLOXACIN HCL 0.5 % OP SOLN
OPHTHALMIC | Status: DC | PRN
  Administered 2017-05-19: .2 mL via OPHTHALMIC

## 2017-05-19 MED ORDER — FENTANYL CITRATE (PF) 100 MCG/2ML IJ SOLN
INTRAMUSCULAR | Status: AC
Start: 2017-05-19 — End: ?
  Filled 2017-05-19: qty 2

## 2017-05-19 MED ORDER — POVIDONE-IODINE 5 % OP SOLN
OPHTHALMIC | Status: DC | PRN
  Administered 2017-05-19: 1 via OPHTHALMIC

## 2017-05-19 MED ORDER — LIDOCAINE HCL (PF) 4 % IJ SOLN
INTRAOCULAR | Status: DC | PRN
  Administered 2017-05-19: 2 mL via OPHTHALMIC

## 2017-05-19 MED ORDER — TRYPAN BLUE 0.06 % OP SOLN
OPHTHALMIC | Status: AC
  Filled 2017-05-19: qty 0.5

## 2017-05-19 MED ORDER — CARBACHOL 0.01 % IO SOLN
INTRAOCULAR | Status: DC | PRN
  Administered 2017-05-19: .5 mL via INTRAOCULAR

## 2017-05-19 MED ORDER — NA CHONDROIT SULF-NA HYALURON 40-17 MG/ML IO SOLN
INTRAOCULAR | Status: AC
  Filled 2017-05-19: qty 1

## 2017-05-19 MED ORDER — MOXIFLOXACIN HCL 0.5 % OP SOLN
1.0000 [drp] | OPHTHALMIC | Status: DC | PRN
Start: 2017-05-19 — End: 2017-05-19

## 2017-05-19 MED ORDER — POVIDONE-IODINE 5 % OP SOLN
OPHTHALMIC | Status: AC
  Filled 2017-05-19: qty 30

## 2017-05-19 MED ORDER — EPINEPHRINE PF 1 MG/ML IJ SOLN
INTRAMUSCULAR | Status: AC
  Filled 2017-05-19: qty 1

## 2017-05-19 MED ORDER — NA CHONDROIT SULF-NA HYALURON 40-17 MG/ML IO SOLN
INTRAOCULAR | Status: DC | PRN
  Administered 2017-05-19: 1 mL via INTRAOCULAR

## 2017-05-19 MED ORDER — SODIUM CHLORIDE 0.9 % IV SOLN
INTRAVENOUS | Status: DC
  Administered 2017-05-19: 50 mL/h via INTRAVENOUS

## 2017-05-19 MED ORDER — ARMC OPHTHALMIC DILATING DROPS
1.0000 "application " | OPHTHALMIC | Status: AC
  Administered 2017-05-19 (×3): 1 via OPHTHALMIC

## 2017-05-19 MED ORDER — MIDAZOLAM HCL 2 MG/2ML IJ SOLN
INTRAMUSCULAR | Status: DC | PRN
  Administered 2017-05-19: 2 mg via INTRAVENOUS

## 2017-05-19 MED ORDER — EPINEPHRINE PF 1 MG/ML IJ SOLN
INTRAOCULAR | Status: DC | PRN
  Administered 2017-05-19: 1 mL via OPHTHALMIC

## 2017-05-19 SURGICAL SUPPLY — 16 items
GLOVE BIO SURGEON STRL SZ8 (GLOVE) ×3 IMPLANT
GLOVE BIOGEL M 6.5 STRL (GLOVE) ×3 IMPLANT
GLOVE SURG LX 8.0 MICRO (GLOVE) ×2
GLOVE SURG LX STRL 8.0 MICRO (GLOVE) ×1 IMPLANT
GOWN STRL REUS W/ TWL LRG LVL3 (GOWN DISPOSABLE) ×2 IMPLANT
GOWN STRL REUS W/TWL LRG LVL3 (GOWN DISPOSABLE) ×4
LABEL CATARACT MEDS ST (LABEL) ×3 IMPLANT
LENS IOL TECNIS ITEC 20.5 (Intraocular Lens) ×2 IMPLANT
PACK CATARACT (MISCELLANEOUS) ×3 IMPLANT
PACK CATARACT BRASINGTON LX (MISCELLANEOUS) ×3 IMPLANT
PACK EYE AFTER SURG (MISCELLANEOUS) ×3 IMPLANT
SOL BSS BAG (MISCELLANEOUS) ×3
SOLUTION BSS BAG (MISCELLANEOUS) ×1 IMPLANT
SYR 5ML LL (SYRINGE) ×3 IMPLANT
WATER STERILE IRR 250ML POUR (IV SOLUTION) ×3 IMPLANT
WIPE NON LINTING 3.25X3.25 (MISCELLANEOUS) ×3 IMPLANT

## 2017-05-19 NOTE — Anesthesia Preprocedure Evaluation (Signed)
Anesthesia Evaluation  Patient identified by MRN, date of birth, ID band Patient awake    Reviewed: Allergy & Precautions, NPO status , Patient's Chart, lab work & pertinent test results  History of Anesthesia Complications Negative for: history of anesthetic complications  Airway Mallampati: II  TM Distance: >3 FB Neck ROM: Full    Dental  (+) Upper Dentures, Lower Dentures   Pulmonary neg sleep apnea, neg COPD, Current Smoker,    breath sounds clear to auscultation- rhonchi (-) wheezing      Cardiovascular Exercise Tolerance: Good (-) hypertension(-) CAD, (-) Past MI, (-) Cardiac Stents and (-) CABG  Rhythm:Regular Rate:Normal - Systolic murmurs and - Diastolic murmurs    Neuro/Psych PSYCHIATRIC DISORDERS Depression negative neurological ROS     GI/Hepatic negative GI ROS, Neg liver ROS,   Endo/Other  negative endocrine ROSneg diabetes  Renal/GU negative Renal ROS     Musculoskeletal  (+) Arthritis ,   Abdominal (+) - obese,   Peds  Hematology negative hematology ROS (+)   Anesthesia Other Findings Past Medical History: No date: Arthritis     Comment:  hands & knees No date: Depression No date: Dyspnea     Comment:  DOE   Reproductive/Obstetrics                             Anesthesia Physical Anesthesia Plan  ASA: II  Anesthesia Plan: MAC   Post-op Pain Management:    Induction: Intravenous  PONV Risk Score and Plan: 0 and Midazolam  Airway Management Planned: Natural Airway  Additional Equipment:   Intra-op Plan:   Post-operative Plan:   Informed Consent: I have reviewed the patients History and Physical, chart, labs and discussed the procedure including the risks, benefits and alternatives for the proposed anesthesia with the patient or authorized representative who has indicated his/her understanding and acceptance.     Plan Discussed with: CRNA and  Anesthesiologist  Anesthesia Plan Comments:         Anesthesia Quick Evaluation

## 2017-05-19 NOTE — H&P (Signed)
All labs reviewed. Abnormal studies sent to patients PCP when indicated.  Previous H&P reviewed, patient examined, there are NO CHANGES.  Dean Pellum Porfilio4/16/201910:21 AM

## 2017-05-19 NOTE — Transfer of Care (Signed)
Immediate Anesthesia Transfer of Care Note  Patient: Dean Dominguez  Procedure(s) Performed: CATARACT EXTRACTION PHACO AND INTRAOCULAR LENS PLACEMENT (IOC) (Right Eye)  Patient Location: Short Stay  Anesthesia Type:MAC  Level of Consciousness: awake, alert  and oriented  Airway & Oxygen Therapy: Patient Spontanous Breathing  Post-op Assessment: Report given to RN  Post vital signs: stable  Last Vitals:  Vitals Value Taken Time  BP    Temp    Pulse    Resp    SpO2      Last Pain:  Vitals:   05/19/17 0954  TempSrc: Tympanic  PainSc: 0-No pain         Complications: No apparent anesthesia complications

## 2017-05-19 NOTE — Discharge Instructions (Signed)
Eye Surgery Discharge Instructions  Expect mild scratchy sensation or mild soreness. DO NOT RUB YOUR EYE!  The day of surgery:  Minimal physical activity, but bed rest is not required  No reading, computer work, or close hand work  No bending, lifting, or straining.  May watch TV  For 24 hours:  No driving, legal decisions, or alcoholic beverages  Safety precautions  Eat anything you prefer: It is better to start with liquids, then soup then solid foods.  _____ Eye patch should be worn until postoperative exam tomorrow.  ____ Solar shield eyeglasses should be worn for comfort in the sunlight/patch while sleeping  Resume all regular medications including aspirin or Coumadin if these were discontinued prior to surgery. You may shower, bathe, shave, or wash your hair. Tylenol may be taken for mild discomfort.  Call your doctor if you experience significant pain, nausea, or vomiting, fever > 101 or other signs of infection. [phone] or [phone] Specific instructions:  Follow-up Information    Galen ManilaPorfilio, William, MD Follow up.   Specialty:  Ophthalmology Why:  April 17 at 1:55pm Contact information: [address] [place] [id number] [phone]

## 2017-05-19 NOTE — Anesthesia Postprocedure Evaluation (Signed)
Anesthesia Post Note  Patient: Dean Dominguez  Procedure(s) Performed: CATARACT EXTRACTION PHACO AND INTRAOCULAR LENS PLACEMENT (IOC) (Right Eye)  Patient location during evaluation: PACU Anesthesia Type: MAC Level of consciousness: awake and alert Pain management: pain level controlled Vital Signs Assessment: post-procedure vital signs reviewed and stable Respiratory status: spontaneous breathing, nonlabored ventilation, respiratory function stable and patient connected to nasal cannula oxygen Cardiovascular status: stable and blood pressure returned to baseline Postop Assessment: no apparent nausea or vomiting Anesthetic complications: no     Last Vitals:  Vitals:   05/19/17 0954  BP: (!) 154/91  Pulse: 67  Resp: 16  Temp: (!) 35.7 C  SpO2: 96%    Last Pain:  Vitals:   05/19/17 0954  TempSrc: Tympanic  PainSc: 0-No pain                 Jamespaul Secrist,  Clearnce Sorrel

## 2017-05-19 NOTE — Op Note (Signed)
PREOPERATIVE DIAGNOSIS:  Nuclear sclerotic cataract of the right eye.   POSTOPERATIVE DIAGNOSIS:  nuclear sclerotic cataract right eye   OPERATIVE PROCEDURE: Procedure(s): CATARACT EXTRACTION PHACO AND INTRAOCULAR LENS PLACEMENT (IOC)   SURGEON:  Galen ManilaWilliam Gabriana Wilmott, MD.   ANESTHESIA:  Anesthesiologist: Alver FisherPenwarden, Amy, MD CRNA: Irving BurtonBachich, Jennifer, CRNA  1.      Managed anesthesia care. 2.      0.111ml of Shugarcaine was instilled in the eye following the paracentesis.   COMPLICATIONS:  None.   TECHNIQUE:   Stop and chop   DESCRIPTION OF PROCEDURE:  The patient was examined and consented in the preoperative holding area where the aforementioned topical anesthesia was applied to the right eye and then brought back to the Operating Room where the right eye was prepped and draped in the usual sterile ophthalmic fashion and a lid speculum was placed. A paracentesis was created with the side port blade and the anterior chamber was filled with viscoelastic. A near clear corneal incision was performed with the steel keratome. A continuous curvilinear capsulorrhexis was performed with a cystotome followed by the capsulorrhexis forceps. Hydrodissection and hydrodelineation were carried out with BSS on a blunt cannula. The lens was removed in a stop and chop  technique and the remaining cortical material was removed with the irrigation-aspiration handpiece. The capsular bag was inflated with viscoelastic and the Technis [id number]  lens was placed in the capsular bag without complication. The remaining viscoelastic was removed from the eye with the irrigation-aspiration handpiece. The wounds were hydrated. The anterior chamber was flushed with Miostat and the eye was inflated to physiologic pressure. 0.631ml of Vigamox was placed in the anterior chamber. The wounds were found to be water tight. The eye was dressed with Vigamox. The patient was given protective glasses to wear throughout the day and a shield with which  to sleep tonight. The patient was also given drops with which to begin a drop regimen today and will follow-up with me in one day. Implant Name Type Inv. Item Serial No. Manufacturer Lot No. LRB No. Used  LENS IOL DIOP 20.5 - [id number] Intraocular Lens LENS IOL DIOP 20.5 [id number]  Right 1   Procedure(s) with comments: CATARACT EXTRACTION PHACO AND INTRAOCULAR LENS PLACEMENT (IOC) (Right) - US 00:30 AP% 14.6 CDE 4.51 Fluid pack lot # [id number]  Electronically signed: Galen ManilaWilliam Antwion Carpenter 05/19/2017 10:47 AM

## 2017-05-19 NOTE — Anesthesia Post-op Follow-up Note (Signed)
Anesthesia QCDR form completed.        

## 2018-02-01 ENCOUNTER — Other Ambulatory Visit: Payer: Self-pay | Admitting: Sports Medicine

## 2018-02-01 DIAGNOSIS — S46212A Strain of muscle, fascia and tendon of other parts of biceps, left arm, initial encounter: Secondary | ICD-10-CM

## 2018-02-01 DIAGNOSIS — M25512 Pain in left shoulder: Secondary | ICD-10-CM

## 2018-02-10 ENCOUNTER — Ambulatory Visit
Admission: RE | Admit: 2018-02-10 | Discharge: 2018-02-10 | Disposition: A | Discharge status: Home / Self Care | Payer: 59 | Source: Ambulatory Visit | Attending: Sports Medicine | Admitting: Sports Medicine

## 2018-02-10 DIAGNOSIS — S46212A Strain of muscle, fascia and tendon of other parts of biceps, left arm, initial encounter: Secondary | ICD-10-CM | POA: Insufficient documentation

## 2018-02-10 DIAGNOSIS — M25512 Pain in left shoulder: Secondary | ICD-10-CM | POA: Diagnosis present

## 2018-02-16 ENCOUNTER — Other Ambulatory Visit: Payer: Self-pay

## 2018-02-16 ENCOUNTER — Encounter: Payer: Self-pay | Admitting: *Deleted

## 2018-02-18 ENCOUNTER — Encounter
Admission: RE | Disposition: A | Discharge status: Home / Self Care | Payer: Self-pay | Source: Home / Self Care | Attending: Orthopedic Surgery

## 2018-02-18 ENCOUNTER — Ambulatory Visit: Payer: 59 | Admitting: Anesthesiology

## 2018-02-18 ENCOUNTER — Encounter: Payer: Self-pay | Admitting: Anesthesiology

## 2018-02-18 ENCOUNTER — Ambulatory Visit
Admission: RE | Admit: 2018-02-18 | Discharge: 2018-02-18 | Disposition: A | Discharge status: Home / Self Care | Payer: 59 | Attending: Orthopedic Surgery | Admitting: Orthopedic Surgery

## 2018-02-18 DIAGNOSIS — S46012A Strain of muscle(s) and tendon(s) of the rotator cuff of left shoulder, initial encounter: Secondary | ICD-10-CM | POA: Diagnosis not present

## 2018-02-18 DIAGNOSIS — Y929 Unspecified place or not applicable: Secondary | ICD-10-CM | POA: Insufficient documentation

## 2018-02-18 DIAGNOSIS — Z803 Family history of malignant neoplasm of breast: Secondary | ICD-10-CM | POA: Diagnosis not present

## 2018-02-18 DIAGNOSIS — Y990 Civilian activity done for income or pay: Secondary | ICD-10-CM | POA: Diagnosis not present

## 2018-02-18 DIAGNOSIS — F17200 Nicotine dependence, unspecified, uncomplicated: Secondary | ICD-10-CM | POA: Diagnosis not present

## 2018-02-18 DIAGNOSIS — M7542 Impingement syndrome of left shoulder: Secondary | ICD-10-CM | POA: Insufficient documentation

## 2018-02-18 DIAGNOSIS — Z8249 Family history of ischemic heart disease and other diseases of the circulatory system: Secondary | ICD-10-CM | POA: Diagnosis not present

## 2018-02-18 DIAGNOSIS — Z8614 Personal history of Methicillin resistant Staphylococcus aureus infection: Secondary | ICD-10-CM | POA: Diagnosis not present

## 2018-02-18 DIAGNOSIS — R030 Elevated blood-pressure reading, without diagnosis of hypertension: Secondary | ICD-10-CM | POA: Insufficient documentation

## 2018-02-18 DIAGNOSIS — M1990 Unspecified osteoarthritis, unspecified site: Secondary | ICD-10-CM | POA: Insufficient documentation

## 2018-02-18 DIAGNOSIS — Z79899 Other long term (current) drug therapy: Secondary | ICD-10-CM | POA: Insufficient documentation

## 2018-02-18 DIAGNOSIS — E785 Hyperlipidemia, unspecified: Secondary | ICD-10-CM | POA: Diagnosis not present

## 2018-02-18 DIAGNOSIS — F419 Anxiety disorder, unspecified: Secondary | ICD-10-CM | POA: Insufficient documentation

## 2018-02-18 DIAGNOSIS — X500XXA Overexertion from strenuous movement or load, initial encounter: Secondary | ICD-10-CM | POA: Insufficient documentation

## 2018-02-18 DIAGNOSIS — F329 Major depressive disorder, single episode, unspecified: Secondary | ICD-10-CM | POA: Insufficient documentation

## 2018-02-18 DIAGNOSIS — S46112A Strain of muscle, fascia and tendon of long head of biceps, left arm, initial encounter: Secondary | ICD-10-CM | POA: Diagnosis not present

## 2018-02-18 DIAGNOSIS — S46212A Strain of muscle, fascia and tendon of other parts of biceps, left arm, initial encounter: Secondary | ICD-10-CM | POA: Diagnosis present

## 2018-02-18 HISTORY — DX: Nicotine dependence, unspecified, uncomplicated: F17.200

## 2018-02-18 HISTORY — PX: SHOULDER ARTHROSCOPY: SHX128

## 2018-02-18 SURGERY — ARTHROSCOPY, SHOULDER
Anesthesia: Regional | Site: Shoulder | Laterality: Left

## 2018-02-18 MED ORDER — BUPIVACAINE LIPOSOME 1.3 % IJ SUSP
INTRAMUSCULAR | Status: DC | PRN
  Administered 2018-02-18: 20 mL via PERINEURAL

## 2018-02-18 MED ORDER — ASPIRIN EC 325 MG PO TBEC: 325.0000 mg | DELAYED_RELEASE_TABLET | Freq: Every day | ORAL | 0 refills | Status: AC

## 2018-02-18 MED ORDER — ONDANSETRON HCL 4 MG/2ML IJ SOLN
INTRAMUSCULAR | Status: DC | PRN
  Administered 2018-02-18: 4 mg via INTRAVENOUS

## 2018-02-18 MED ORDER — OXYCODONE HCL 5 MG PO TABS: 5.0000 mg | ORAL_TABLET | ORAL | 0 refills | Status: DC | PRN

## 2018-02-18 MED ORDER — ONDANSETRON 4 MG PO TBDP: 4.0000 mg | ORAL_TABLET | Freq: Three times a day (TID) | ORAL | 0 refills | Status: DC | PRN

## 2018-02-18 MED ORDER — OXYCODONE HCL 5 MG/5ML PO SOLN: 5.0000 mg | Freq: Once | ORAL | Status: DC | PRN

## 2018-02-18 MED ORDER — LACTATED RINGERS IV SOLN
10.0000 mL/h | INTRAVENOUS | Status: DC
  Administered 2018-02-18: 10 mL/h via INTRAVENOUS

## 2018-02-18 MED ORDER — CEFAZOLIN SODIUM-DEXTROSE 2-4 GM/100ML-% IV SOLN
2.0000 g | Freq: Once | INTRAVENOUS | Status: AC
  Administered 2018-02-18: 2 g via INTRAVENOUS

## 2018-02-18 MED ORDER — FENTANYL CITRATE (PF) 100 MCG/2ML IJ SOLN
INTRAMUSCULAR | Status: DC | PRN
  Administered 2018-02-18: 100 ug via INTRAVENOUS
  Administered 2018-02-18: 25 ug via INTRAVENOUS

## 2018-02-18 MED ORDER — OXYCODONE HCL 5 MG PO TABS: 5.0000 mg | ORAL_TABLET | Freq: Once | ORAL | Status: DC | PRN

## 2018-02-18 MED ORDER — GLYCOPYRROLATE 0.2 MG/ML IJ SOLN
INTRAMUSCULAR | Status: DC | PRN
  Administered 2018-02-18: 0.1 mg via INTRAVENOUS

## 2018-02-18 MED ORDER — MIDAZOLAM HCL 5 MG/5ML IJ SOLN
INTRAMUSCULAR | Status: DC | PRN
  Administered 2018-02-18 (×2): 2 mg via INTRAVENOUS

## 2018-02-18 MED ORDER — BUPIVACAINE HCL (PF) 0.5 % IJ SOLN
INTRAMUSCULAR | Status: DC | PRN
  Administered 2018-02-18: 20 mL via PERINEURAL

## 2018-02-18 MED ORDER — FENTANYL CITRATE (PF) 100 MCG/2ML IJ SOLN: 25.0000 ug | INTRAMUSCULAR | Status: DC | PRN

## 2018-02-18 MED ORDER — LACTATED RINGERS IV SOLN
INTRAVENOUS | Status: DC | PRN
  Administered 2018-02-18: 16 mL

## 2018-02-18 MED ORDER — ACETAMINOPHEN 325 MG PO TABS: 325.0000 mg | ORAL_TABLET | ORAL | Status: DC | PRN

## 2018-02-18 MED ORDER — EPHEDRINE SULFATE 50 MG/ML IJ SOLN
INTRAMUSCULAR | Status: DC | PRN
  Administered 2018-02-18: 10 mg via INTRAVENOUS
  Administered 2018-02-18: 5 mg via INTRAVENOUS
  Administered 2018-02-18: 10 mg via INTRAVENOUS
  Administered 2018-02-18 (×3): 5 mg via INTRAVENOUS
  Administered 2018-02-18: 10 mg via INTRAVENOUS

## 2018-02-18 MED ORDER — PROPOFOL 10 MG/ML IV BOLUS
INTRAVENOUS | Status: DC | PRN
  Administered 2018-02-18: 200 mg via INTRAVENOUS

## 2018-02-18 MED ORDER — ACETAMINOPHEN 160 MG/5ML PO SOLN: 325.0000 mg | ORAL | Status: DC | PRN

## 2018-02-18 MED ORDER — PROMETHAZINE HCL 25 MG/ML IJ SOLN: 6.2500 mg | INTRAMUSCULAR | Status: DC | PRN

## 2018-02-18 MED ORDER — ACETAMINOPHEN 500 MG PO TABS: 1000.0000 mg | ORAL_TABLET | Freq: Three times a day (TID) | ORAL | 2 refills | Status: AC

## 2018-02-18 MED ORDER — DEXAMETHASONE SODIUM PHOSPHATE 4 MG/ML IJ SOLN
INTRAMUSCULAR | Status: DC | PRN
  Administered 2018-02-18: 4 mg via INTRAVENOUS

## 2018-02-18 SURGICAL SUPPLY — 47 items
ADAPTER IRRIG TUBE 2 SPIKE SOL (ADAPTER) ×6 IMPLANT
BUR BR 5.5 12 FLUTE (BURR) ×2 IMPLANT
BUR RADIUS 4.0X18.5 (BURR) ×4 IMPLANT
CANNULA 5.75X7 CRYSTAL CLEAR (CANNULA) ×2 IMPLANT
CHLORAPREP W/TINT 26ML (MISCELLANEOUS) ×3 IMPLANT
COOLER POLAR GLACIER W/PUMP (MISCELLANEOUS) ×3 IMPLANT
COVER LIGHT HANDLE UNIVERSAL (MISCELLANEOUS) ×3 IMPLANT
DRAPE IMP U-DRAPE 54X76 (DRAPES) ×6 IMPLANT
DRAPE INCISE IOBAN 66X45 STRL (DRAPES) ×3 IMPLANT
DRAPE SHEET LG 3/4 BI-LAMINATE (DRAPES) ×3 IMPLANT
DRAPE U-SHAPE 48X52 POLY STRL (PACKS) ×6 IMPLANT
ELECT REM PT RETURN 9FT ADLT (ELECTROSURGICAL) ×3
ELECTRODE REM PT RTRN 9FT ADLT (ELECTROSURGICAL) ×1 IMPLANT
GAUZE PETRO XEROFOAM 1X8 (MISCELLANEOUS) ×3 IMPLANT
GLOVE BIO SURGEON STRL SZ7.5 (GLOVE) ×6 IMPLANT
GLOVE BIOGEL PI IND STRL 8 (GLOVE) ×1 IMPLANT
GLOVE BIOGEL PI INDICATOR 8 (GLOVE) ×2
GOWN STRL REUS W/ TWL LRG LVL3 (GOWN DISPOSABLE) ×1 IMPLANT
GOWN STRL REUS W/TWL LRG LVL3 (GOWN DISPOSABLE) ×4
GOWN STRL REUS W/TWL LRG LVL4 (GOWN DISPOSABLE) ×3 IMPLANT
IMP SYSTEM BRIDGE 4.75X19.1 (Anchor) ×3 IMPLANT
IMPL SYSTEM BRIDGE 4.75X19.1 (Anchor) IMPLANT
IV LACTATED RINGER IRRG 3000ML (IV SOLUTION) ×32
IV LR IRRIG 3000ML ARTHROMATIC (IV SOLUTION) ×8 IMPLANT
KIT STABILIZATION SHOULDER (MISCELLANEOUS) ×3 IMPLANT
MANIFOLD 4PT FOR NEPTUNE1 (MISCELLANEOUS) ×3 IMPLANT
MASK FACE SPIDER DISP (MASK) ×3 IMPLANT
MAT ABSORB  FLUID 56X50 GRAY (MISCELLANEOUS) ×4
MAT ABSORB FLUID 56X50 GRAY (MISCELLANEOUS) ×2 IMPLANT
NDL SAFETY ECLIPSE 18X1.5 (NEEDLE) ×1 IMPLANT
NDL SCORPION MULTI FIRE (NEEDLE) IMPLANT
NEEDLE HYPO 18GX1.5 SHARP (NEEDLE) ×2
NEEDLE SCORPION MULTI FIRE (NEEDLE) ×6 IMPLANT
PACK ARTHROSCOPY SHOULDER (MISCELLANEOUS) ×3 IMPLANT
PAD WRAPON POLAR SHDR UNIV (MISCELLANEOUS) ×1 IMPLANT
PENCIL SMOKE EVACUATOR (MISCELLANEOUS) ×3 IMPLANT
SET TUBE SUCT SHAVER OUTFL 24K (TUBING) ×3 IMPLANT
SET TUBE TIP INTRA-ARTICULAR (MISCELLANEOUS) ×3 IMPLANT
SUT ETHILON 3-0 (SUTURE) ×4 IMPLANT
SYR 10ML LL (SYRINGE) ×3 IMPLANT
TAPE MICROFOAM 4IN (TAPE) ×3 IMPLANT
TUBING ARTHRO INFLOW-ONLY STRL (TUBING) ×3 IMPLANT
TUBING CONNECTING 10 (TUBING) ×2 IMPLANT
TUBING CONNECTING 10' (TUBING) ×1
WAND WEREWOLF FLOW 90D (MISCELLANEOUS) ×2 IMPLANT
WRAPON POLAR PAD SHDR UNIV (MISCELLANEOUS) ×3
passport button cannula 8mm x 3cm ×2 IMPLANT

## 2018-02-18 NOTE — Anesthesia Postprocedure Evaluation (Signed)
Anesthesia Post Note  Patient: Dean Dominguez  Procedure(s) Performed: ARTHROSCOPY SHOULDER WITH EXTENSIVE DEBRIDEMENT, ROTATOR CUFF REPAIR AND SUBACROMIAL DECOMPRESSION (Left Shoulder)  Patient location during evaluation: PACU Anesthesia Type: General Level of consciousness: awake Pain management: pain level controlled Vital Signs Assessment: post-procedure vital signs reviewed and stable Respiratory status: respiratory function stable Cardiovascular status: stable Postop Assessment: no signs of nausea or vomiting Anesthetic complications: no    Jola Babinski

## 2018-02-18 NOTE — Transfer of Care (Signed)
Immediate Anesthesia Transfer of Care Note  Patient: Dean Dominguez  Procedure(s) Performed: ARTHROSCOPY SHOULDER WITH EXTENSIVE DEBRIDEMENT, ROTATOR CUFF REPAIR AND SUBACROMIAL DECOMPRESSION (Left Shoulder)  Patient Location: PACU  Anesthesia Type: General, Regional  Level of Consciousness: awake, alert  and patient cooperative  Airway and Oxygen Therapy: Patient Spontanous Breathing and Patient connected to supplemental oxygen  Post-op Assessment: Post-op Vital signs reviewed, Patient's Cardiovascular Status Stable, Respiratory Function Stable, Patent Airway and No signs of Nausea or vomiting  Post-op Vital Signs: Reviewed and stable  Complications: No apparent anesthesia complications

## 2018-02-18 NOTE — Anesthesia Preprocedure Evaluation (Addendum)
Anesthesia Evaluation  Patient identified by MRN, date of birth, ID band Patient awake    Reviewed: Allergy & Precautions, NPO status , Patient's Chart, lab work & pertinent test results  Airway Mallampati: II  TM Distance: >3 FB    Comment: beard Dental  (+) Chipped   Pulmonary Current Smoker,    breath sounds clear to auscultation       Cardiovascular negative cardio ROS   Rhythm:Regular Rate:Normal     Neuro/Psych Depression    GI/Hepatic negative GI ROS,   Endo/Other    Renal/GU      Musculoskeletal  (+) Arthritis ,   Abdominal   Peds  Hematology   Anesthesia Other Findings   Reproductive/Obstetrics                            Anesthesia Physical Anesthesia Plan  ASA: II  Anesthesia Plan: General and Regional   Post-op Pain Management:  Regional for Post-op pain   Induction: Intravenous  PONV Risk Score and Plan: Ondansetron and Dexamethasone  Airway Management Planned: LMA  Additional Equipment:   Intra-op Plan:   Post-operative Plan:   Informed Consent: I have reviewed the patients History and Physical, chart, labs and discussed the procedure including the risks, benefits and alternatives for the proposed anesthesia with the patient or authorized representative who has indicated his/her understanding and acceptance.     Dental advisory given  Plan Discussed with: CRNA  Anesthesia Plan Comments: (Interscalene block with Exparel for post op pain at request of Dr. Allena Katz.)        Anesthesia Quick Evaluation

## 2018-02-18 NOTE — H&P (Signed)
Paper H&P to be scanned into permanent record. H&P reviewed. No significant changes noted.  

## 2018-02-18 NOTE — Op Note (Addendum)
SURGERY DATE: 02/18/2018  PRE-OP DIAGNOSIS:  1. Left subacromial impingement 2. Left proximal biceps rupture 3. Left rotator cuff tear  POST-OP DIAGNOSIS: 1. Left subacromial impingement 2. Left proximal biceps rupture 3. Left rotator cuff tear  PROCEDURES:  1. Left arthroscopic rotator cuff repair (supraspinatus) 2. Left subacromial decompression 3. Left extensive debridement of shoulder (glenohumeral and subacromial spaces)  SURGEON: Rosealee AlbeeSunny H. Jonnatan Hanners, MD  ANESTHESIA: Gen with Exparil interscalene block  ESTIMATED BLOOD LOSS: 5cc  DRAINS:  none  TOTAL IV FLUIDS: per anesthesia   SPECIMENS: none  IMPLANTS:  - Arthrex 4.5475mm SwiveLock - x4 (SpeedBridge)  OPERATIVE FINDINGS:  Examination under anesthesia: A careful examination under anesthesia was performed.  Passive range of motion was: FF: 160; ER at side: 45; ER in abduction:90; IR in abduction: 50.  Anterior load shift: 1+.  Posterior load shift: 1+.  Sulcus in neutral: NT.  Sulcus in ER: NT.    Intra-operative findings: A thorough arthroscopic examination of the shoulder was performed.  The findings are: 1. Biceps tendon: Not visualized in the intra-articular space with severe fraying from the superior labral attachment 2. Superior labrum: Significant fraying and synovitic tissue 3. Posterior labrum and capsule: normal 4. Inferior capsule and inferior recess: normal 5. Glenoid cartilage surface: normal 6. Supraspinatus attachment: Full-thickness tear 7. Posterior rotator cuff attachment: Intact 8. Humeral head articular cartilage: Normal 9. Rotator interval: significant synovitis 10: Subscapularis tendon: Intact 11. Anterior labrum: Significant degenerative changes 12. IGHL: normal  OPERATIVE REPORT:   Indications for procedure: Dean Dominguez is a 58 y.o. male with who began having pain over the past 2 months but had an acute popping injury on 01/04/2018 while at work.  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  Clinical exam and MRI findings were consistent with full-thickness supraspinatus tear. After discussion of risks, benefits, and alternatives to surgery, the patient elected to proceed.     Procedure in detail:  I identified Dean Dykesrthur W Larose Dominguez in the pre-operative holding area.  I marked the operative shoulder with my initials. I reviewed the risks and benefits of the proposed surgical intervention, and the patient wished to proceed.  Anesthesia was then performed with an interscalene block.  The patient was placed in the beach chair position.    SCDs were placed on the lower extremities. Appropriate IV antibiotics were administered prior to incision. The operative upper extremity was then prepped and draped in standard fashion. A time out was performed confirming the correct extremity, correct patient, and correct procedure.   I then created a standard posterior portal with an 11 blade. The glenohumeral joint was easily entered with a blunt trochar and the arthroscope introduced. The findings of diagnostic arthroscopy are described above. I debrided degenerative tissue including the synovitic tissue about the rotator interval, proximal biceps tendon remnant, anterior labrum, and superior labrum. I then coagulated the inflamed synovium to obtain hemostasis and reduce the risk of post-operative swelling using an Arthrocare radiofrequency device.   Next, the arthroscope was then introduced into the subacromial space. A direct lateral portal was created with an 11-blade after spinal needle localization. An extensive subacromial bursectomy was performed using a combination of the shaver and Arthrocare wand. The entire acromial undersurface was exposed and the CA ligament was subperiosteally elevated to expose the anterior acromial hook. A 5.305mm burr was used to create a flat anterior and lateral aspect of the acromion, converting it from a Type 2 to  a Type 1 acromion.  The  edges of the torn rotator cuff were gently trimmed with the shaver.  The rotator cuff tear was a U-shaped tear with retraction to the articular margin at its apex.  A grasper was used to assess mobility of the rotator cuff and it was found to entirely cover the footprint.  Given this, I elected to proceed with the rotator cuff repair.  The rotator cuff footprint was smoothed and and a bleeding surface was created using a burr.  I then percutaneously placed two medial row anchors (Arthrex 4.75 mm SwiveLocks with FiberTape) for a speed bridge construct. These were placed at the anterior and posterior borders of the tear, at the articular margin. I then passed the tape from each anchor through the rotator cuff just lateral to the musculotendinous junction, one anteriorly and one posteriorly.  The arm was then abducted slightly.  I then took one tape limb from each of the 2 tapes in each anchor and fixed them 2cm distal to the tip of the greater tuberosity in line with the anteromedial and posteromedial anchors to create a crossed, double row suture configuration. Lateral row fixation was obtained with two 4.75 mm Arthrex SwiveLock anchors. This afforded homogeneous compression of the tendon across the entirety of the prepared footprint.  The rotator cuff was stable to internal and external rotation.  Fluid was evacuated from the shoulder, and the portals were closed with 3-0 Nylon. Xeroform was applied to the portals. A sterile dressing was applied, followed by a Polar Care sleeve and a SlingShot shoulder immobilizer/sling. The patient was awakened from anesthesia without difficulty and was transferred to the PACU in stable condition.     COMPLICATIONS: none  DISPOSITION: plan for discharge home after recovery in PACU   POSTOPERATIVE PLAN: Remain in sling (except hygiene and elbow/wrist/hand RoM exercises as instructed by PT) x 6 weeks and NWB for this time. PT to begin 3-4 days after surgery.  Although  this was a small/medium tear, we will plan to use large rotator cuff repair rehab guidelines for increased immobilization given the patient's smoking status.

## 2018-02-18 NOTE — Discharge Instructions (Signed)
Post-Op Instructions - Rotator Cuff Repair  1. Bracing: You will wear a shoulder immobilizer or sling for 6 weeks.   2. Driving: No driving for 3 weeks post-op. When driving, do not wear the immobilizer. Ideally, we recommend no driving for 6 weeks while sling is in place as one arm will be immobilized.   3. Activity: No active lifting for 2 months. Wrist, hand, and elbow motion only. Avoid lifting the upper arm away from the body except for hygiene. You are permitted to bend and straighten the elbow passively only (no active elbow motion). You may use your hand and wrist for typing, writing, and managing utensils (cutting food). Do not lift more than a coffee cup for 8 weeks.  When sleeping or resting, inclined positions (recliner chair or wedge pillow) and a pillow under the forearm for support may provide better comfort for up to 4 weeks.  Avoid long distance travel for 4 weeks.  Return to normal activities after rotator cuff repair repair normally takes 6 months on average. If rehab goes very well, may be able to do most activities at 4 months, except overhead or contact sports.  4. Physical Therapy: Begins 3-4 days after surgery, and proceed 1 time per week for the first 6 weeks, then 1-2 times per week from weeks 6-20 post-op.  5. Medications:  - You will be provided a prescription for narcotic pain medicine. After surgery, take 1-2 narcotic tablets every 4 hours if needed for severe pain.  - A prescription for anti-nausea medication will be provided in case the narcotic medicine causes nausea - take 1 tablet every 6 hours only if nauseated.   - Take tylenol 1000 mg (2 Extra Strength tablets or 3 regular strength) every 8 hours for pain.  May decrease or stop tylenol 5 days after surgery if you are having minimal pain. - Take ASA 325mg/day x 2 weeks to help prevent DVTs/PEs (blood clots).  - DO NOT take ANY nonsteroidal anti-inflammatory pain medications (Advil, Motrin, Ibuprofen, Aleve,  Naproxen, or Naprosyn). These medicines can inhibit healing of your shoulder repair.    If you are taking prescription medication for anxiety, depression, insomnia, muscle spasm, chronic pain, or for attention deficit disorder, you are advised that you are at a higher risk of adverse effects with use of narcotics post-op, including narcotic addiction/dependence, depressed breathing, death. If you use non-prescribed substances: alcohol, marijuana, cocaine, heroin, methamphetamines, etc., you are at a higher risk of adverse effects with use of narcotics post-op, including narcotic addiction/dependence, depressed breathing, death. You are advised that taking > 50 morphine milligram equivalents (MME) of narcotic pain medication per day results in twice the risk of overdose or death. For your prescription provided: oxycodone 5 mg - taking more than 6 tablets per day would result in > 50 morphine milligram equivalents (MME) of narcotic pain medication. Be advised that we will prescribe narcotics short-term, for acute post-operative pain only - 3 weeks for major operations such as shoulder repair/reconstruction surgeries.     6. Post-Op Appointment:  Your first post-op appointment will be 10-14 days post-op.  7. Work or School: For most, but not all procedures, we advise staying out of work or school for at least 1 to 2 weeks in order to recover from the stress of surgery and to allow time for healing.   If you need a work or school note this can be provided.   8. Smoking: If you are a smoker, you need to refrain from   smoking in the postoperative period. The nicotine in cigarettes will inhibit healing of your shoulder repair and decrease the chance of successful repair. Similarly, nicotine containing products (gum, patches) should be avoided.   Post-operative Brace: Apply and remove the brace you received as you were instructed to at the time of fitting and as described in detail as the braces  instructions for use indicate.  Wear the brace for the period of time prescribed by your physician.  The brace can be cleaned with soap and water and allowed to air dry only.  Should the brace result in increased pain, decreased feeling (numbness/tingling), increased swelling or an overall worsening of your medical condition, please contact your doctor immediately.  If an emergency situation occurs as a result of wearing the brace after normal business hours, please dial 911 and seek immediate medical attention.  Let your doctor know if you have any further questions about the brace issued to you. Refer to the shoulder sling instructions for use if you have any questions regarding the correct fit of your shoulder sling.  Dean Dominguez for Troubleshooting: [phone]  Video that illustrates how to properly use a shoulder sling: "Instructions for Proper Use of an Orthopaedic Sling" [email or web address]        General Anesthesia, Adult, Care After This sheet gives you information about how to care for yourself after your procedure. Your health care provider may also give you more specific instructions. If you have problems or questions, contact your health care provider. What can I expect after the procedure? After the procedure, the following side effects are common:  Pain or discomfort at the IV site.  Nausea.  Vomiting.  Sore throat.  Trouble concentrating.  Feeling cold or chills.  Weak or tired.  Sleepiness and fatigue.  Soreness and body aches. These side effects can affect parts of the body that were not involved in surgery. Follow these instructions at home:  For at least 24 hours after the procedure:  Have a responsible adult stay with you. It is important to have someone help care for you until you are awake and alert.  Rest as needed.  Do not: ? Participate in activities in which you could fall or become injured. ? Drive. ? Use  heavy machinery. ? Drink alcohol. ? Take sleeping pills or medicines that cause drowsiness. ? Make important decisions or sign legal documents. ? Take care of children on your own. Eating and drinking  Follow any instructions from your health care provider about eating or drinking restrictions.  When you feel hungry, start by eating small amounts of foods that are soft and easy to digest (bland), such as toast. Gradually return to your regular diet.  Drink enough fluid to keep your urine pale yellow.  If you vomit, rehydrate by drinking water, juice, or clear broth. General instructions  If you have sleep apnea, surgery and certain medicines can increase your risk for breathing problems. Follow instructions from your health care provider about wearing your sleep device: ? Anytime you are sleeping, including during daytime naps. ? While taking prescription pain medicines, sleeping medicines, or medicines that make you drowsy.  Return to your normal activities as told by your health care provider. Ask your health care provider what activities are safe for you.  Take over-the-counter and prescription medicines only as told by your health care provider.  If you smoke, do not smoke without supervision.  Keep all follow-up visits as told by your health care provider. This  is important. Contact a health care provider if:  You have nausea or vomiting that does not get better with medicine.  You cannot eat or drink without vomiting.  You have pain that does not get better with medicine.  You are unable to pass urine.  You develop a skin rash.  You have a fever.  You have redness around your IV site that gets worse. Get help right away if:  You have difficulty breathing.  You have chest pain.  You have blood in your urine or stool, or you vomit blood. Summary  After the procedure, it is common to have a sore throat or nausea. It is also common to feel tired.  Have a  responsible adult stay with you for the first 24 hours after general anesthesia. It is important to have someone help care for you until you are awake and alert.  When you feel hungry, start by eating small amounts of foods that are soft and easy to digest (bland), such as toast. Gradually return to your regular diet.  Drink enough fluid to keep your urine pale yellow.  Return to your normal activities as told by your health care provider. Ask your health care provider what activities are safe for you. This information is not intended to replace advice given to you by your health care provider. Make sure you discuss any questions you have with your health care provider. Document Released: 04/28/2000 Document Revised: 09/05/2016 Document Reviewed: 09/05/2016 Elsevier Interactive Patient Education  2019 [employer].  Information for Discharge Teaching: EXPAREL (bupivacaine liposome injectable suspension)   Your surgeon or anesthesiologist gave you EXPAREL(bupivacaine) to help control your pain after surgery.   EXPAREL is a local anesthetic that provides pain relief by numbing the tissue around the surgical site.  EXPAREL is designed to release pain medication over time and can control pain for up to 72 hours.  Depending on how you respond to EXPAREL, you may require less pain medication during your recovery.  Possible side effects:  Temporary loss of sensation or ability to move in the area where bupivacaine was injected.  Nausea, vomiting, constipation  Rarely, numbness and tingling in your mouth or lips, lightheadedness, or anxiety may occur.  Call your doctor right away if you think you may be experiencing any of these sensations, or if you have other questions regarding possible side effects.  Follow all other discharge instructions given to you by your surgeon or nurse. Eat a healthy diet and drink plenty of water or other fluids.  If you return to the hospital for any reason  within 96 hours following the administration of EXPAREL, it is important for health care providers to know that you have received this anesthetic. A teal colored band has been placed on your arm with the date, time and amount of EXPAREL you have received in order to alert and inform your health care providers. Please leave this armband in place for the full 96 hours following administration, and then you may remove the band.

## 2018-02-18 NOTE — Anesthesia Procedure Notes (Signed)
Anesthesia Regional Block: Interscalene brachial plexus block   Pre-Anesthetic Checklist: ,, timeout performed, Correct Patient, Correct Site, Correct Laterality, Correct Procedure, Correct Position, site marked, Risks and benefits discussed,  Surgical consent,  Pre-op evaluation,  At surgeon's request and post-op pain management  Laterality: Left  Prep: chloraprep       Needles:  Injection technique: Single-shot  Needle Type: Stimiplex     Needle Length: 9cm  Needle Gauge: 21     Additional Needles:   Procedures:,,,, ultrasound used (permanent image in chart),,,,  Narrative:  Start time: 02/18/2018 10:58 AM End time: 02/18/2018 11:04 AM Injection made incrementally with aspirations every 5 mL.  Performed by: Personally  Anesthesiologist: Jola Babinski, MD

## 2018-02-18 NOTE — Anesthesia Procedure Notes (Signed)
Procedure Name: LMA Insertion Date/Time: 02/18/2018 12:22 PM Performed by: Maree Krabbe, CRNA Pre-anesthesia Checklist: Patient identified, Emergency Drugs available, Suction available, Timeout performed and Patient being monitored Patient Re-evaluated:Patient Re-evaluated prior to induction Oxygen Delivery Method: Circle system utilized Preoxygenation: Pre-oxygenation with 100% oxygen Induction Type: IV induction LMA: LMA inserted LMA Size: 4.0 Number of attempts: 1 Placement Confirmation: positive ETCO2 and breath sounds checked- equal and bilateral Tube secured with: Tape Dental Injury: Teeth and Oropharynx as per pre-operative assessment

## 2018-02-19 ENCOUNTER — Encounter: Payer: Self-pay | Admitting: Orthopedic Surgery

## 2018-03-03 DIAGNOSIS — M75101 Unspecified rotator cuff tear or rupture of right shoulder, not specified as traumatic: Secondary | ICD-10-CM | POA: Insufficient documentation

## 2019-02-04 ENCOUNTER — Encounter: Payer: Self-pay | Admitting: Emergency Medicine

## 2019-02-04 ENCOUNTER — Ambulatory Visit (INDEPENDENT_AMBULATORY_CARE_PROVIDER_SITE_OTHER): Payer: 59

## 2019-02-04 ENCOUNTER — Ambulatory Visit
Admission: EM | Admit: 2019-02-04 | Discharge: 2019-02-04 | Disposition: A | Discharge status: Home / Self Care | Payer: 59 | Attending: Family Medicine | Admitting: Family Medicine

## 2019-02-04 ENCOUNTER — Other Ambulatory Visit: Payer: Self-pay

## 2019-02-04 DIAGNOSIS — W0110XA Fall on same level from slipping, tripping and stumbling with subsequent striking against unspecified object, initial encounter: Secondary | ICD-10-CM | POA: Diagnosis not present

## 2019-02-04 DIAGNOSIS — S5001XA Contusion of right elbow, initial encounter: Secondary | ICD-10-CM

## 2019-02-04 DIAGNOSIS — M25521 Pain in right elbow: Secondary | ICD-10-CM

## 2019-02-04 DIAGNOSIS — T148XXA Other injury of unspecified body region, initial encounter: Secondary | ICD-10-CM

## 2019-02-04 MED ORDER — HYDROCODONE-ACETAMINOPHEN 5-325 MG PO TABS: 1.0000 | ORAL_TABLET | Freq: Three times a day (TID) | ORAL | 0 refills | Status: DC | PRN

## 2019-02-04 MED ORDER — KETOROLAC TROMETHAMINE 10 MG PO TABS: 10.0000 mg | ORAL_TABLET | Freq: Four times a day (QID) | ORAL | 0 refills | Status: DC | PRN

## 2019-02-04 NOTE — ED Provider Notes (Signed)
[facility]    CSN: [id number] Arrival date & time: 02/04/19  1505  History   Chief Complaint Chief Complaint  Patient presents with  . Fall  . Arm Pain    right lower   HPI  59 year old male presents with the above complaint.  Patient states that he slipped on wet steps this morning.  He subsequently fell and landed on his right forearm/elbow.  He reports significant pain and swelling just distal to the olecranon.  Suffered an abrasion as well.  Rates his pain as 8/10 in severity.  He has fairly good range of motion of the elbow but has severe pain particularly at the area of swelling..  Exacerbated by touch.  No relieving factors.  No other complaints.  PMH, Surgical Hx, Family Hx, Social History reviewed and updated as below.  Past Medical History:  Diagnosis Date  . Arthritis    hands & knees  . Depression   . Dyspnea    DOE (pt denies 02/16/18)  . Tobacco dependence    Past Surgical History:  Procedure Laterality Date  . ANTERIOR CRUCIATE LIGAMENT REPAIR Right 1999  . CATARACT EXTRACTION W/PHACO Left 04/29/2017   Procedure: CATARACT EXTRACTION PHACO AND INTRAOCULAR LENS PLACEMENT (IOC);  Surgeon: Galen Manila, MD;  Location: [facility];  Service: Ophthalmology;  Laterality: Left;  [place] 00:34 AP% 17.6 CDE 6.09 Fluid pak lot # [id number]  . CATARACT EXTRACTION W/PHACO Right 05/19/2017   Procedure: CATARACT EXTRACTION PHACO AND INTRAOCULAR LENS PLACEMENT (IOC);  Surgeon: Galen Manila, MD;  Location: [facility];  Service: Ophthalmology;  Laterality: Right;  [place] 00:30 AP% 14.6 CDE 4.51 Fluid pack lot # [id number]  . METAL PLATES  left foot Left 2018  . SHOULDER ARTHROSCOPY Left 02/18/2018   Procedure: ARTHROSCOPY SHOULDER WITH EXTENSIVE DEBRIDEMENT, ROTATOR CUFF REPAIR AND SUBACROMIAL DECOMPRESSION;  Surgeon: Signa Kell, MD;  Location: [facility];  Service: Orthopedics;  Laterality: Left;  ARTHREX SWIVELOCK 4.75 MM  OTHER ARTHROCARE WAND BARROL BURR  OPEN SHID KOLBALL HOMANS COBBS  . TENDON REPAIR Left 01/09/2017   Procedure: TENDON REPAIR-TIBIALIS TENDON;  Surgeon: Gwyneth Revels, DPM;  Location: [facility];  Service: Podiatry;  Laterality: Left;  . TONSILLECTOMY    . WISDOM TOOTH EXTRACTION  1978   2 wisdom teeth extracted       Home Medications    Prior to Admission medications   Medication Sig Start Date End Date Taking? Authorizing Provider  acetaminophen (TYLENOL) 500 MG tablet Take 2 tablets (1,000 mg total) by mouth every 8 (eight) hours. 02/18/18 02/18/19 Yes Signa Kell, MD  fluticasone [facility]) 50 MCG/ACT nasal spray Place 1 spray into both nostrils daily.   Yes [provider]  Multiple Vitamins-Minerals (MULTIVITAMIN PO) Take 1 tablet by mouth daily.   Yes [provider]  venlafaxine XR (EFFEXOR-XR) 75 MG 24 hr capsule Take 300 mg by mouth at bedtime.  12/04/16  Yes [provider]  HYDROcodone-acetaminophen (NORCO/VICODIN) 5-325 MG tablet Take 1 tablet by mouth every 8 (eight) hours as needed. 02/04/19   Tommie Sams, DO  ketorolac (TORADOL) 10 MG tablet Take 1 tablet (10 mg total) by mouth every 6 (six) hours as needed for moderate pain or severe pain. 02/04/19   Tommie Sams, DO  ondansetron (ZOFRAN ODT) 4 MG disintegrating tablet Take 1 tablet (4 mg total) by mouth every 8 (eight) hours as needed for nausea or vomiting. 02/18/18   Signa Kell, MD    Family History Family History  Problem  Relation Age of Onset  . Hypertension Mother   . Healthy Father     Social History Social History   Tobacco Use  . Smoking status: Current Every Day Smoker    Packs/day: 1.00    Years: 40.00    Pack years: 40.00    Types: Cigarettes  . Smokeless tobacco: Former Systems developer    Quit date: 02/03/1997  Substance Use Topics  . Alcohol use: No    Comment: history of alcohol abuse, beer quit 10/09/2008  . Drug use: Yes    Frequency: 2.0 times per week    Types: Marijuana    Comment: used 10 days ago      Allergies   Patient has no known allergies.   Review of Systems Review of Systems  Constitutional: Negative.   Musculoskeletal:       Right elbow pain, swelling.   Physical Exam Triage Vital Signs ED Triage Vitals  Enc Vitals Group     BP 02/04/19 1532 (!) 150/92     Pulse Rate 02/04/19 1532 60     Resp 02/04/19 1532 16     Temp 02/04/19 1532 98.4 F (36.9 C)     Temp Source 02/04/19 1532 Oral     SpO2 02/04/19 1532 100 %     Weight 02/04/19 1529 170 lb (77.1 kg)     Height 02/04/19 1529 5\' 9"  (1.753 m)     Head Circumference --      Peak Flow --      Pain Score 02/04/19 1528 8     Pain Loc --      Pain Edu? --      Excl. in [place]? --    Updated Vital Signs BP (!) 150/92 (BP Location: Left Arm)   Pulse 60   Temp 98.4 F (36.9 C) (Oral)   Resp 16   Ht 5\' 9"  (1.753 m)   Wt 77.1 kg   SpO2 100%   BMI 25.10 kg/m   Visual Acuity Right Eye Distance:   Left Eye Distance:   Bilateral Distance:    Right Eye Near:   Left Eye Near:    Bilateral Near:     Physical Exam Vitals and nursing note reviewed.  Constitutional:      General: He is not in acute distress.    Appearance: Normal appearance. He is not ill-appearing.  HENT:     Head: Normocephalic and atraumatic.  Eyes:     General:        Right eye: No discharge.        Left eye: No discharge.     Conjunctiva/sclera: Conjunctivae normal.  Pulmonary:     Effort: Pulmonary effort is normal. No respiratory distress.  Musculoskeletal:     Comments: Right elbow -tenderness over the medial elbow.  Patient has a large hematoma just distal to the olecranon.  This area is exquisitely tender to palpation.  Skin:    Comments: Right elbow -small abrasion noted.  Neurological:     Mental Status: He is alert.  Psychiatric:        Mood and Affect: Mood normal.        Behavior: Behavior normal.     UC Treatments / Results  Labs (all labs ordered are listed, but only abnormal results are displayed) Labs  Reviewed - No data to display  EKG   Radiology DG Elbow Complete Right  Result Date: 02/04/2019 CLINICAL DATA:  Fall EXAM: RIGHT ELBOW - COMPLETE 3+ VIEW COMPARISON:  07/15/2007 FINDINGS: No fracture or dislocation of the right elbow. Joint spaces are preserved. No elbow joint effusion. Soft tissue hematoma overlying the proximal ulna. IMPRESSION: 1. No fracture or dislocation of the right elbow. No elbow joint effusion. 2. Soft tissue hematoma overlying the proximal ulna. Electronically Signed   By: Lauralyn Primes M.D.   On: 02/04/2019 15:56    Procedures Procedures (including critical care time)  Medications Ordered in UC Medications - No data to display  Initial Impression / Assessment and Plan / UC Course  I have reviewed the triage vital signs and the nursing notes.  Pertinent labs & imaging results that were available during my care of the patient were reviewed by me and considered in my medical decision making (see chart for details).    59 year old male presents with a hematoma.  X-ray was negative for fracture.  Advised rest, ice, elevation.  Toradol as needed for pain.  Vicodin if needed for severe pain not controlled with Toradol.  [place] controlled substance database reviewed.  No concerns for abuse at this time.  Final Clinical Impressions(s) / UC Diagnoses   Final diagnoses:  Hematoma     Discharge Instructions     Elevate.   Ice.  Medication as needed.  Take care  Dr. Adriana Simas    ED Prescriptions    Medication Sig Dispense Auth. Provider   ketorolac (TORADOL) 10 MG tablet Take 1 tablet (10 mg total) by mouth every 6 (six) hours as needed for moderate pain or severe pain. 20 tablet Dealva Lafoy G, DO   HYDROcodone-acetaminophen (NORCO/VICODIN) 5-325 MG tablet Take 1 tablet by mouth every 8 (eight) hours as needed. 10 tablet Everlene Other G, DO     I have reviewed the PDMP during this encounter.   Tommie Sams, [place] 02/04/19 1843

## 2019-02-04 NOTE — ED Triage Notes (Signed)
Patient states that he slipped on the wet steps this morning and fell and landed on his right lower arm.  Patient c/o swelling and pain in his right lower arm.

## 2019-02-04 NOTE — Discharge Instructions (Addendum)
Elevate.   Ice.  Medication as needed.  Take care  Dr. Adriana Simas

## 2019-08-24 DIAGNOSIS — I10 Essential (primary) hypertension: Secondary | ICD-10-CM | POA: Insufficient documentation

## 2020-01-17 ENCOUNTER — Other Ambulatory Visit: Payer: Self-pay

## 2020-01-17 DIAGNOSIS — F17210 Nicotine dependence, cigarettes, uncomplicated: Secondary | ICD-10-CM | POA: Insufficient documentation

## 2020-01-17 DIAGNOSIS — K8051 Calculus of bile duct without cholangitis or cholecystitis with obstruction: Secondary | ICD-10-CM | POA: Insufficient documentation

## 2020-01-17 DIAGNOSIS — R110 Nausea: Secondary | ICD-10-CM | POA: Insufficient documentation

## 2020-01-17 DIAGNOSIS — T6291XA Toxic effect of unspecified noxious substance eaten as food, accidental (unintentional), initial encounter: Secondary | ICD-10-CM | POA: Insufficient documentation

## 2020-01-17 DIAGNOSIS — Z79899 Other long term (current) drug therapy: Secondary | ICD-10-CM | POA: Insufficient documentation

## 2020-01-17 DIAGNOSIS — K8000 Calculus of gallbladder with acute cholecystitis without obstruction: Secondary | ICD-10-CM | POA: Diagnosis not present

## 2020-01-17 DIAGNOSIS — R1013 Epigastric pain: Secondary | ICD-10-CM | POA: Insufficient documentation

## 2020-01-17 DIAGNOSIS — R109 Unspecified abdominal pain: Secondary | ICD-10-CM | POA: Diagnosis not present

## 2020-01-17 MED ORDER — ONDANSETRON 4 MG PO TBDP
4.0000 mg | ORAL_TABLET | Freq: Once | ORAL | Status: AC | PRN
  Administered 2020-01-17: 4 mg via ORAL
  Filled 2020-01-17: qty 1

## 2020-01-17 NOTE — ED Triage Notes (Addendum)
Pt states Sunday he had a plate of food that sat in his car all day when home put it in the frig , then the next day he ate it for lunch and within began having abd pain with N/V.Marland Kitchen denies any diarrhea. Just continues to have dry heaving   Pt had labs drawn at [facility] walk in clinic and the results are in care everywhere

## 2020-01-18 ENCOUNTER — Emergency Department: Payer: 59

## 2020-01-18 ENCOUNTER — Emergency Department
Admission: EM | Admit: 2020-01-18 | Discharge: 2020-01-18 | Disposition: A | Discharge status: Home / Self Care | Payer: 59 | Source: Home / Self Care | Attending: Emergency Medicine | Admitting: Emergency Medicine

## 2020-01-18 DIAGNOSIS — R110 Nausea: Secondary | ICD-10-CM

## 2020-01-18 DIAGNOSIS — A059 Bacterial foodborne intoxication, unspecified: Secondary | ICD-10-CM

## 2020-01-18 DIAGNOSIS — R1013 Epigastric pain: Secondary | ICD-10-CM

## 2020-01-18 DIAGNOSIS — K8050 Calculus of bile duct without cholangitis or cholecystitis without obstruction: Secondary | ICD-10-CM

## 2020-01-18 MED ORDER — ONDANSETRON 4 MG PO TBDP: ORAL_TABLET | ORAL | 0 refills | Status: DC

## 2020-01-18 NOTE — ED Notes (Signed)
Patient transported to Ultrasound 

## 2020-01-18 NOTE — ED Notes (Signed)
Pt returned from u/s

## 2020-01-18 NOTE — ED Provider Notes (Signed)
[facility] Emergency Department Provider Note  ____________________________________________   Event Date/Time   First MD Initiated Contact with Patient 01/18/20 [phone]     (approximate)  I have reviewed the triage vital signs and the nursing notes.   HISTORY  Chief Complaint Abdominal Pain    HPI Dean Dominguez Dominguez is a 59 y.o. male with medical history as listed below who presents for evaluation of acute onset and severe upper abdominal dull and aching pain associated with nausea and "dry heaves".  This started yesterday about 30 minutes after he ate a plate of food that had stayed out in his car all day the previous day which she had then put in the refrigerator.  He has not had any diarrhea.  He denies any lower abdominal pain and says the pain in his upper abdomen is nonradiating.  Nothing particular made it better or worse and it was constant for hours until he finally went to the urgent care for evaluation.  He had lab work done at the urgent care and then they told him to go to the emergency department.  The patient has been waiting for 9 hours to be evaluated due to overwhelming ED and hospital patient volumes, and he said he feels much better now.  He no longer has any pain at rest and he has been sleeping.  He no longer feels nauseated.  He denies recent fever/chills, sore throat, chest pain, shortness of breath, lower abdominal pain, dysuria.  He has not had any abdominal surgeries in the past.          Past Medical History:  Diagnosis Date  . Arthritis    hands & knees  . Depression   . Dyspnea    DOE (pt denies 02/16/18)  . Tobacco dependence     There are no problems to display for this patient.   Past Surgical History:  Procedure Laterality Date  . ANTERIOR CRUCIATE LIGAMENT REPAIR Right 1999  . CATARACT EXTRACTION W/PHACO Left 04/29/2017   Procedure: CATARACT EXTRACTION PHACO AND INTRAOCULAR LENS PLACEMENT (IOC);  Surgeon: Galen Manila, MD;  Location: [facility];  Service: Ophthalmology;  Laterality: Left;  [place] 00:34 AP% 17.6 CDE 6.09 Fluid pak lot # [id number]  . CATARACT EXTRACTION W/PHACO Right 05/19/2017   Procedure: CATARACT EXTRACTION PHACO AND INTRAOCULAR LENS PLACEMENT (IOC);  Surgeon: Galen Manila, MD;  Location: [facility];  Service: Ophthalmology;  Laterality: Right;  [place] 00:30 AP% 14.6 CDE 4.51 Fluid pack lot # [id number]  . METAL PLATES  left foot Left 2018  . SHOULDER ARTHROSCOPY Left 02/18/2018   Procedure: ARTHROSCOPY SHOULDER WITH EXTENSIVE DEBRIDEMENT, ROTATOR CUFF REPAIR AND SUBACROMIAL DECOMPRESSION;  Surgeon: Signa Kell, MD;  Location: [facility];  Service: Orthopedics;  Laterality: Left;  ARTHREX SWIVELOCK 4.75 MM  OTHER ARTHROCARE WAND BARROL BURR OPEN SHID KOLBALL HOMANS COBBS  . TENDON REPAIR Left 01/09/2017   Procedure: TENDON REPAIR-TIBIALIS TENDON;  Surgeon: Gwyneth Revels, DPM;  Location: [facility];  Service: Podiatry;  Laterality: Left;  . TONSILLECTOMY    . WISDOM TOOTH EXTRACTION  1978   2 wisdom teeth extracted    Prior to Admission medications   Medication Sig Start Date End Date Taking? Authorizing Provider  fluticasone (FLONASE) 50 MCG/ACT nasal spray Place 1 spray into both nostrils daily.    [provider]  HYDROcodone-acetaminophen (NORCO/VICODIN) 5-325 MG tablet Take 1 tablet by mouth every 8 (eight) hours as needed. 02/04/19   Tommie Sams, DO  ketorolac (TORADOL) 10 MG tablet Take 1 tablet (10 mg total) by mouth every 6 (six) hours as needed for moderate pain or severe pain. 02/04/19   Tommie Sams, DO  Multiple Vitamins-Minerals (MULTIVITAMIN PO) Take 1 tablet by mouth daily.    [provider]  ondansetron (ZOFRAN ODT) 4 MG disintegrating tablet Allow 1-2 tablets to dissolve in your mouth every 8 hours as needed for nausea/vomiting 01/18/20   Loleta Rose, MD  venlafaxine XR (EFFEXOR-XR) 75 MG 24 hr capsule Take 300 mg by mouth at bedtime.  12/04/16    [provider]    Allergies Patient has no known allergies.  Family History  Problem Relation Age of Onset  . Hypertension Mother   . Healthy Father     Social History Social History   Tobacco Use  . Smoking status: Current Every Day Smoker    Packs/day: 1.00    Years: 40.00    Pack years: 40.00    Types: Cigarettes  . Smokeless tobacco: Former Neurosurgeon    Quit date: 02/03/1997  Vaping Use  . Vaping Use: Never used  Substance Use Topics  . Alcohol use: No    Comment: history of alcohol abuse, beer quit 10/09/2008  . Drug use: Yes    Frequency: 2.0 times per week    Types: Marijuana    Comment: used 10 days ago    Review of Systems Constitutional: No fever/chills Eyes: No visual changes. ENT: No sore throat. Cardiovascular: Denies chest pain. Respiratory: Denies shortness of breath. Gastrointestinal: Acute onset and severe upper abdominal pain with nausea and "dry heaves", no diarrhea. Genitourinary: Negative for dysuria. Musculoskeletal: Negative for neck pain.  Negative for back pain. Integumentary: Negative for rash. Neurological: Negative for headaches, focal weakness or numbness.   ____________________________________________   PHYSICAL EXAM:  VITAL SIGNS: ED Triage Vitals  Enc Vitals Group     BP 01/17/20 1540 (!) 159/74     Pulse Rate 01/17/20 1540 77     Resp 01/17/20 1540 18     Temp 01/17/20 1540 98.6 F (37 C)     Temp Source 01/17/20 1540 Oral     SpO2 01/17/20 1540 96 %     Weight 01/17/20 1541 76.3 kg (168 lb 3.2 oz)     Height 01/17/20 1541 1.778 m (5\' 10" )     Head Circumference --      Peak Flow --      Pain Score 01/17/20 1541 3     Pain Loc --      Pain Edu? --      Excl. in [place]? --     Constitutional: Alert and oriented.  Eyes: Conjunctivae are normal.  Head: Atraumatic. Nose: No congestion/rhinnorhea. Mouth/Throat: Patient is wearing a mask. Neck: No stridor.  No meningeal signs.   Cardiovascular: Normal rate, regular  rhythm. Good peripheral circulation. Respiratory: Normal respiratory effort.  No retractions. Gastrointestinal: Soft and nondistended.  No lower abdominal tenderness to palpation.  Tender to palpation of the epigastrium with some mild guarding with more significant tenderness in the right upper quadrant with a mildly positive Murphy sign. Musculoskeletal: No lower extremity tenderness nor edema. No gross deformities of extremities. Neurologic:  Normal speech and language. No gross focal neurologic deficits are appreciated.  Skin:  Skin is warm, dry and intact. Psychiatric: Mood and affect are normal. Speech and behavior are normal.  ____________________________________________   LABS (all labs ordered are listed, but only abnormal results are displayed)  Labs Reviewed  URINALYSIS, COMPLETE (UACMP) WITH MICROSCOPIC   ____________________________________________  EKG  ED ECG REPORT I, Loleta Roseory Kiriana Worthington, the attending physician, personally viewed and interpreted this ECG.  Date: 01/17/2020 EKG Time: 15: 49 Rate: 78 Rhythm: normal sinus rhythm QRS Axis: normal Intervals: Right bundle branch block and left posterior fascicular block ST/T Wave abnormalities: Non-specific ST segment / T-wave changes, but no clear evidence of acute ischemia. Narrative Interpretation: no definitive evidence of acute ischemia; does not meet STEMI criteria.   ____________________________________________  RADIOLOGY I, Loleta Roseory Sedra Morfin, personally viewed and evaluated these images (plain radiographs) as part of my medical decision making, as well as reviewing the written report by the radiologist.  ED MD interpretation:  Stones and sludge, mildly thickened gallbladder wall, but without definitive evidence of cholecystitis and with a negative sonographic Murphy sign.  Official radiology report(s): US ABDOMEN LIMITED RUQ (LIVER/GB)  Result Date: 01/18/2020 CLINICAL DATA:  Epigastric pain, vomiting EXAM:  ULTRASOUND ABDOMEN LIMITED RIGHT UPPER QUADRANT COMPARISON:  None. FINDINGS: Gallbladder: Numerous gallstones measuring up to 1.7 cm. Sludge also noted within the gallbladder. Wall is thickened measuring 6 mm. Common bile duct: Diameter: Normal caliber, 6 mm. Liver: Normal echotexture. Echogenic focus in the left hepatic lobe measures up to 12 mm. Echogenic focus in the right hepatic lobe measures 6 mm. Appearance is most compatible with hemangiomas. Portal vein is patent on color Doppler imaging with normal direction of blood flow towards the liver. Other: None. IMPRESSION: Gallstones and sludge within the gallbladder. Gallbladder wall thickening. Negative sonographic Murphy sign. Small hyperechoic lesions within the liver, most compatible with hemangiomas. Electronically Signed   By: Charlett NoseKevin  Dover M.D.   On: 01/18/2020 02:00    ____________________________________________   PROCEDURES   Procedure(s) performed (including Critical Care):  Procedures   ____________________________________________   INITIAL IMPRESSION / MDM / ASSESSMENT AND PLAN / ED COURSE  As part of my medical decision making, I reviewed the following data within the electronic MEDICAL RECORD NUMBER Nursing notes reviewed and incorporated, Labs reviewed , Old chart reviewed and Notes from prior ED visits   Differential diagnosis includes, but is not limited to, viral gastritis, "food poisoning", GERD, pancreatitis, biliary disease, atypical ACS presentation.  The patient has had no chest pain, shortness of breath, and has no ischemic changes on EKG.  Vital signs have been stable after more than 9 hours in the emergency department.  [facility] and verified through care everywhere were notable for an essentially normal CMP but a leukocytosis of 19.6.  However the patient feels much better and has been stable for 9 hours.  However he has tenderness to palpation of the epigastrium and right upper quadrant.   I think that the most likely diagnosis is foodborne pathogen, but cholecystitis is also possible.  He agrees with the plan for right upper quadrant ultrasound.  If this is normal and he is able to tolerate a p.o. challenge, I think he is appropriate for discharge and outpatient follow-up and he agrees with the plan.  He is in no pain at this time.     Clinical Course as of 01/18/20 0250  Wed Jan 18, 2020  0232 US ABDOMEN LIMITED RUQ (LIVER/GB) Patient has some gallstones and sludge but no evidence of acute cholecystitis.  He has been sleeping since his ultrasound.  I woke him up and he said he feels fine, no persistent nausea nor abdominal pain.  I counseled him that his symptoms could be the result of food poisoning  or could be biliary coli I strongly encouraged him to follow-up both with his regular doctor and with the surgery clinic and I provided him follow-up information.  He understands and agrees with the plan and I gave my usual and customary return precautions.   [CF]    Clinical Course User Index [CF] Loleta Rose, MD     ____________________________________________  FINAL CLINICAL IMPRESSION(S) / ED DIAGNOSES  Final diagnoses:  Epigastric pain  Nausea  Biliary colic  Food poisoning     MEDICATIONS GIVEN DURING THIS VISIT:  Medications  ondansetron (ZOFRAN-ODT) disintegrating tablet 4 mg (4 mg Oral Given 01/17/20 1545)     ED Discharge Orders         Ordered    ondansetron (ZOFRAN ODT) 4 MG disintegrating tablet        01/18/20 0234          *Please note:  Dean Dominguez was evaluated in Emergency Department on 01/18/2020 for the symptoms described in the history of present illness. He was evaluated in the context of the global COVID-19 pandemic, which necessitated consideration that the patient might be at risk for infection with the SARS-CoV-2 virus that causes COVID-19. Institutional protocols and algorithms that pertain to the evaluation of patients at  risk for COVID-19 are in a state of rapid change based on information released by regulatory bodies including the CDC and federal and state organizations. These policies and algorithms were followed during the patient's care in the ED.  Some ED evaluations and interventions may be delayed as a result of limited staffing during and after the pandemic.*  Note:  This document was prepared using Dragon voice recognition software and may include unintentional dictation errors.   Loleta Rose, MD 01/18/20 [phone]

## 2020-01-18 NOTE — Discharge Instructions (Signed)
As we discussed, your symptoms could have been the result of bad food, but it also could be that your gallbladder was acting up and causing your pain (biliary colic).  Please try to stay hydrated over the next day or 2 and use the prescribed nausea medicine if needed.  Read through the included information about both conditions.  I recommend you follow-up with your primary care doctor but also consider scheduling an appointment with Dr. Everlene Farrier or one of his surgical colleagues.  They can talk with you to discuss whether or not you would benefit from having your gallbladder removed and an elective surgery.  Use over-the-counter ibuprofen and/or Tylenol as needed for mild to moderate pain.  Return to the emergency department if you develop new or worsening symptoms that concern you.

## 2020-01-19 ENCOUNTER — Ambulatory Visit (INDEPENDENT_AMBULATORY_CARE_PROVIDER_SITE_OTHER): Payer: 59 | Admitting: General Surgery

## 2020-01-19 ENCOUNTER — Encounter: Payer: Self-pay | Admitting: General Surgery

## 2020-01-19 ENCOUNTER — Other Ambulatory Visit: Payer: Self-pay | Admitting: General Surgery

## 2020-01-19 ENCOUNTER — Inpatient Hospital Stay
Admission: EM | Admit: 2020-01-19 | Discharge: 2020-01-20 | DRG: 446 | Disposition: A | Discharge status: Home / Self Care | Payer: 59 | Attending: General Surgery | Admitting: General Surgery

## 2020-01-19 ENCOUNTER — Other Ambulatory Visit: Payer: Self-pay

## 2020-01-19 ENCOUNTER — Inpatient Hospital Stay: Payer: 59

## 2020-01-19 ENCOUNTER — Encounter: Payer: Self-pay | Admitting: Intensive Care

## 2020-01-19 VITALS — BP 158/80 | HR 82 | Temp 98.4°F | Ht 70.0 in | Wt 167.4 lb

## 2020-01-19 DIAGNOSIS — Z791 Long term (current) use of non-steroidal anti-inflammatories (NSAID): Secondary | ICD-10-CM

## 2020-01-19 DIAGNOSIS — I714 Abdominal aortic aneurysm, without rupture: Secondary | ICD-10-CM | POA: Diagnosis present

## 2020-01-19 DIAGNOSIS — Z8249 Family history of ischemic heart disease and other diseases of the circulatory system: Secondary | ICD-10-CM

## 2020-01-19 DIAGNOSIS — K810 Acute cholecystitis: Secondary | ICD-10-CM | POA: Diagnosis present

## 2020-01-19 DIAGNOSIS — F32A Depression, unspecified: Secondary | ICD-10-CM | POA: Diagnosis present

## 2020-01-19 DIAGNOSIS — I10 Essential (primary) hypertension: Secondary | ICD-10-CM | POA: Diagnosis present

## 2020-01-19 DIAGNOSIS — Z20822 Contact with and (suspected) exposure to covid-19: Secondary | ICD-10-CM | POA: Diagnosis present

## 2020-01-19 DIAGNOSIS — R933 Abnormal findings on diagnostic imaging of other parts of digestive tract: Secondary | ICD-10-CM | POA: Diagnosis not present

## 2020-01-19 DIAGNOSIS — M170 Bilateral primary osteoarthritis of knee: Secondary | ICD-10-CM | POA: Diagnosis present

## 2020-01-19 DIAGNOSIS — Z79899 Other long term (current) drug therapy: Secondary | ICD-10-CM | POA: Diagnosis not present

## 2020-01-19 DIAGNOSIS — F17210 Nicotine dependence, cigarettes, uncomplicated: Secondary | ICD-10-CM | POA: Diagnosis present

## 2020-01-19 DIAGNOSIS — K819 Cholecystitis, unspecified: Secondary | ICD-10-CM

## 2020-01-19 DIAGNOSIS — M19042 Primary osteoarthritis, left hand: Secondary | ICD-10-CM | POA: Diagnosis present

## 2020-01-19 DIAGNOSIS — D72829 Elevated white blood cell count, unspecified: Secondary | ICD-10-CM | POA: Diagnosis not present

## 2020-01-19 DIAGNOSIS — M19041 Primary osteoarthritis, right hand: Secondary | ICD-10-CM | POA: Diagnosis present

## 2020-01-19 DIAGNOSIS — R1011 Right upper quadrant pain: Secondary | ICD-10-CM

## 2020-01-19 DIAGNOSIS — K8000 Calculus of gallbladder with acute cholecystitis without obstruction: Principal | ICD-10-CM | POA: Diagnosis present

## 2020-01-19 DIAGNOSIS — R109 Unspecified abdominal pain: Secondary | ICD-10-CM | POA: Diagnosis present

## 2020-01-19 HISTORY — DX: Essential (primary) hypertension: I10

## 2020-01-19 LAB — COMPREHENSIVE METABOLIC PANEL
ALT: 13 U/L (ref 0–44)
AST: 17 U/L (ref 15–41)
Albumin: 3.1 g/dL — ABNORMAL LOW (ref 3.5–5.0)
Alkaline Phosphatase: 65 U/L (ref 38–126)
Anion gap: 12 (ref 5–15)
BUN: 24 mg/dL — ABNORMAL HIGH (ref 8–23)
CO2: 27 mmol/L (ref 22–32)
Calcium: 8.6 mg/dL — ABNORMAL LOW (ref 8.9–10.3)
Chloride: 96 mmol/L — ABNORMAL LOW (ref 98–111)
Creatinine, Ser: 0.93 mg/dL (ref 0.61–1.24)
GFR, Estimated: >60 mL/min (ref >60)
Glucose, Bld: 134 mg/dL — ABNORMAL HIGH (ref 70–99)
Potassium: 3.8 mmol/L (ref 3.5–5.1)
Sodium: 135 mmol/L (ref 135–145)
Total Bilirubin: 0.7 mg/dL (ref 0.3–1.2)
Total Protein: 7.1 g/dL (ref 6.5–8.1)

## 2020-01-19 LAB — CBC
HCT: 42.2 % (ref 39.0–52.0)
Hemoglobin: 14.1 g/dL (ref 13.0–17.0)
MCH: 33.6 pg (ref 26.0–34.0)
MCHC: 33.4 g/dL (ref 30.0–36.0)
MCV: 100.5 fL — ABNORMAL HIGH (ref 80.0–100.0)
Platelets: 345 10*3/uL (ref 150–400)
RBC: 4.2 MIL/uL — ABNORMAL LOW (ref 4.22–5.81)
RDW: 13.1 % (ref 11.5–15.5)
WBC: 12.3 10*3/uL — ABNORMAL HIGH (ref 4.0–10.5)
nRBC: 0 % (ref 0.0–0.2)

## 2020-01-19 LAB — RESP PANEL BY RT-PCR (FLU A&B, COVID) ARPGX2
Influenza A by PCR: NEGATIVE
Influenza B by PCR: NEGATIVE
SARS Coronavirus 2 by RT PCR: NEGATIVE

## 2020-01-19 LAB — PROTIME-INR
INR: 1 (ref 0.8–1.2)
Prothrombin Time: 12.4 seconds (ref 11.4–15.2)

## 2020-01-19 LAB — APTT: aPTT: 30 seconds (ref 24–36)

## 2020-01-19 MED ORDER — ONDANSETRON HCL 4 MG/2ML IJ SOLN: 4.0000 mg | Freq: Four times a day (QID) | INTRAMUSCULAR | Status: DC | PRN

## 2020-01-19 MED ORDER — PIPERACILLIN-TAZOBACTAM 3.375 G IVPB
3.3750 g | Freq: Three times a day (TID) | INTRAVENOUS | Status: DC
  Administered 2020-01-19 – 2020-01-20 (×4): 3.375 g via INTRAVENOUS
  Filled 2020-01-19 (×4): qty 50

## 2020-01-19 MED ORDER — SODIUM CHLORIDE 0.9 % IV SOLN: INTRAVENOUS | Status: DC

## 2020-01-19 MED ORDER — ACETAMINOPHEN 500 MG PO TABS
1000.0000 mg | ORAL_TABLET | Freq: Four times a day (QID) | ORAL | Status: DC
  Administered 2020-01-19 – 2020-01-20 (×4): 1000 mg via ORAL
  Filled 2020-01-19 (×4): qty 2

## 2020-01-19 MED ORDER — FENTANYL CITRATE (PF) 100 MCG/2ML IJ SOLN
INTRAMUSCULAR | Status: AC | PRN
  Administered 2020-01-19 (×4): 50 ug via INTRAVENOUS

## 2020-01-19 MED ORDER — HYDROMORPHONE HCL 1 MG/ML IJ SOLN: 0.5000 mg | INTRAMUSCULAR | Status: DC | PRN

## 2020-01-19 MED ORDER — FENTANYL CITRATE (PF) 100 MCG/2ML IJ SOLN
INTRAMUSCULAR | Status: AC
  Filled 2020-01-19: qty 2

## 2020-01-19 MED ORDER — SODIUM CHLORIDE FLUSH 0.9 % IV SOLN
INTRAVENOUS | Status: AC
  Administered 2020-01-20: 13:00:00 5 mL
  Filled 2020-01-19: qty 10

## 2020-01-19 MED ORDER — ONDANSETRON 4 MG PO TBDP: 4.0000 mg | ORAL_TABLET | Freq: Four times a day (QID) | ORAL | Status: DC | PRN

## 2020-01-19 MED ORDER — MIDAZOLAM HCL 2 MG/2ML IJ SOLN
INTRAMUSCULAR | Status: AC | PRN
  Administered 2020-01-19 (×4): 1 mg via INTRAVENOUS

## 2020-01-19 MED ORDER — OXYCODONE HCL 5 MG PO TABS: 5.0000 mg | ORAL_TABLET | ORAL | Status: DC | PRN

## 2020-01-19 MED ORDER — KETOROLAC TROMETHAMINE 15 MG/ML IJ SOLN
15.0000 mg | Freq: Four times a day (QID) | INTRAMUSCULAR | Status: DC | PRN
Start: 2020-01-19 — End: 2020-01-20
  Filled 2020-01-19: qty 1

## 2020-01-19 MED ORDER — KETOROLAC TROMETHAMINE 15 MG/ML IJ SOLN
15.0000 mg | Freq: Four times a day (QID) | INTRAMUSCULAR | Status: AC
  Filled 2020-01-19: qty 1

## 2020-01-19 MED ORDER — AMOXICILLIN-POT CLAVULANATE 875-125 MG PO TABS: 1.0000 | ORAL_TABLET | Freq: Two times a day (BID) | ORAL | 0 refills | Status: DC

## 2020-01-19 MED ORDER — IOHEXOL 300 MG/ML  SOLN
100.0000 mL | Freq: Once | INTRAMUSCULAR | Status: AC | PRN
  Administered 2020-01-19: 14:00:00 100 mL via INTRAVENOUS
  Filled 2020-01-19: qty 100

## 2020-01-19 MED ORDER — SODIUM CHLORIDE 0.9% FLUSH
5.0000 mL | Freq: Three times a day (TID) | INTRAVENOUS | Status: DC
  Administered 2020-01-19 – 2020-01-20 (×2): 5 mL

## 2020-01-19 MED ORDER — MIDAZOLAM HCL 2 MG/2ML IJ SOLN
INTRAMUSCULAR | Status: AC
  Filled 2020-01-19: qty 4

## 2020-01-19 NOTE — ED Triage Notes (Signed)
Patient sent by provider for gallbladder removal. C/o RUQ removal. WBC elevated. Tender to touch

## 2020-01-19 NOTE — Progress Notes (Signed)
Patient ID: Dean Dominguez, male   DOB: 1960-06-26, 59 y.o.   MRN: [id number]  Chief Complaint  Patient presents with  . Follow-up    NP biliary colic fup ED from 01/17/20    HPI Dean Dominguez is a 59 y.o. male.   He is here today as a referral from the emergency department.  He was seen there on Tuesday, 14 December.  He says that he initially thought he was having food poisoning from some old food that was left out, but after it did not abate, he went to the [facility].  He had abdominal pain and nausea; labs obtained at [facility] clinic demonstrated a white blood cell count of 19.6 K.  He was sent to the emergency department.  Evaluation there included a right upper quadrant ultrasound that demonstrated significant gallbladder wall thickening, but no pericholecystic fluid.  The ultrasound report states no sonographic Murphy sign.  The patient received some antiemetics and pain medication and felt better, so he was sent home with instructions to follow-up in surgery clinic.  Today, he reports that his epigastric and right upper quadrant pain persists.  He is so tender that he is unable to apply any pressure to his right side.  Because of this, he has not been able to sleep much, needing to sleep sitting upright.  He continues to endorse nausea and has been having diarrhea.  He has not had any emesis.  He has not been able to eat, but states he has been able to sip on ginger ale.  He has never had a similar episode.  He has never had pancreatitis or jaundice.   Past Medical History:  Diagnosis Date  . Arthritis    hands & knees  . Depression   . Dyspnea    DOE (pt denies 02/16/18)  . Tobacco dependence     Past Surgical History:  Procedure Laterality Date  . ANTERIOR CRUCIATE LIGAMENT REPAIR Right 1999  . CATARACT EXTRACTION W/PHACO Left 04/29/2017   Procedure: CATARACT EXTRACTION PHACO AND INTRAOCULAR LENS PLACEMENT (IOC);  Surgeon: Galen Manila, MD;  Location: [facility];   Service: Ophthalmology;  Laterality: Left;  [place] 00:34 AP% 17.6 CDE 6.09 Fluid pak lot # [id number]  . CATARACT EXTRACTION W/PHACO Right 05/19/2017   Procedure: CATARACT EXTRACTION PHACO AND INTRAOCULAR LENS PLACEMENT (IOC);  Surgeon: Galen Manila, MD;  Location: [facility] OR[facility];  Service: Ophthalmology;  Laterality: Right;  [place] 00:30 AP% 14.6 CDE 4.51 Fluid pack lot # [id number]  . METAL PLATES  left foot Left 2018  . SHOULDER ARTHROSCOPY Left 02/18/2018   Procedure: ARTHROSCOPY SHOULDER WITH EXTENSIVE DEBRIDEMENT, ROTATOR CUFF REPAIR AND SUBACROMIAL DECOMPRESSION;  Surgeon: Signa Kell, MD;  Location: [facility];  Service: Orthopedics;  Laterality: Left;  ARTHREX SWIVELOCK 4.75 MM  OTHER ARTHROCARE WAND BARROL BURR OPEN SHID KOLBALL HOMANS COBBS  . TENDON REPAIR Left 01/09/2017   Procedure: TENDON REPAIR-TIBIALIS TENDON;  Surgeon: Gwyneth Revels, DPM;  Location: [facility];  Service: Podiatry;  Laterality: Left;  . TONSILLECTOMY    . WISDOM TOOTH EXTRACTION  1978   2 wisdom teeth extracted    Family History  Problem Relation Age of Onset  . Hypertension Mother   . Healthy Father     Social History Social History   Tobacco Use  . Smoking status: Current Every Day Smoker    Packs/day: 1.00    Years: 40.00    Pack years: 40.00    Types: Cigarettes  . Smokeless  tobacco: Former Neurosurgeon    Quit date: 02/03/1997  Vaping Use  . Vaping Use: Never used  Substance Use Topics  . Alcohol use: No    Comment: history of alcohol abuse, beer quit 10/09/2008  . Drug use: Yes    Frequency: 2.0 times per week    Types: Marijuana    Comment: used 10 days ago    No Known Allergies  Current Outpatient Medications  Medication Sig Dispense Refill  . acetaminophen (TYLENOL) 650 MG CR tablet Take by mouth.    . fluticasone (FLONASE) 50 MCG/ACT nasal spray Place 1 spray into both nostrils daily.    Marland Kitchen losartan (COZAAR) 25 MG tablet Take 25 mg by mouth daily.    . Multiple Vitamins-Minerals  (MULTIVITAMIN PO) Take 1 tablet by mouth daily.    . naproxen (NAPROSYN) 500 MG tablet Take 500 mg by mouth 2 (two) times daily.    . ondansetron (ZOFRAN ODT) 4 MG disintegrating tablet Allow 1-2 tablets to dissolve in your mouth every 8 hours as needed for nausea/vomiting 30 tablet 0  . TURMERIC PO Take by mouth.    . venlafaxine XR (EFFEXOR-XR) 75 MG 24 hr capsule Take 300 mg by mouth at bedtime.   0  . amoxicillin-clavulanate (AUGMENTIN) 875-125 MG tablet Take 1 tablet by mouth 2 (two) times daily for 14 days. 28 tablet 0  . HYDROcodone-acetaminophen (NORCO/VICODIN) 5-325 MG tablet Take 1 tablet by mouth every 8 (eight) hours as needed. (Patient not taking: Reported on 01/19/2020) 10 tablet 0  . ketorolac (TORADOL) 10 MG tablet Take 1 tablet (10 mg total) by mouth every 6 (six) hours as needed for moderate pain or severe pain. (Patient not taking: Reported on 01/19/2020) 20 tablet 0   No current facility-administered medications for this visit.    Review of Systems Review of Systems  Constitutional: Positive for appetite change, chills and fever.  HENT: Positive for hearing loss and tinnitus.   Cardiovascular: Positive for chest pain.  Gastrointestinal: Positive for diarrhea and nausea.  Psychiatric/Behavioral: Positive for dysphoric mood.  All other systems reviewed and are negative.   Blood pressure (!) 158/80, pulse 82, temperature 98.4 F (36.9 C), temperature source Oral, height 5\' 10"  (1.778 m), weight 167 lb 6.4 oz (75.9 kg), SpO2 98 %. Body mass index is 24.02 kg/m.  Physical Exam Physical Exam Constitutional:      General: He is not in acute distress.    Appearance: He is normal weight. He is ill-appearing.     Comments: He appears uncomfortable.  HENT:     Head: Normocephalic and atraumatic.     Nose:     Comments: Covered with a mask    Mouth/Throat:     Comments: Covered with a mask Eyes:     General: No scleral icterus.       Right eye: No discharge.         Left eye: No discharge.  Neck:     Comments: No palpable cervical or supraclavicular lymphadenopathy.  The trachea is midline.  There is no thyromegaly nor any dominant thyroid mass.  The gland is freely with deglutition. Cardiovascular:     Rate and Rhythm: Normal rate and regular rhythm.     Pulses: Normal pulses.  Pulmonary:     Effort: Pulmonary effort is normal.     Breath sounds: Normal breath sounds.  Abdominal:     Palpations: Abdomen is soft.     Tenderness: There is abdominal tenderness. There is guarding  and rebound.     Comments: He is acutely tender to even light palpation in the right upper quadrant and epigastrium.  Murphy sign is positive.  Genitourinary:    Comments: Deferred Musculoskeletal:        General: No swelling or tenderness.     Cervical back: No rigidity.  Lymphadenopathy:     Cervical: No cervical adenopathy.  Skin:    General: Skin is warm and dry.     Coloration: Skin is not jaundiced.  Neurological:     General: No focal deficit present.     Mental Status: He is alert and oriented to person, place, and time.  Psychiatric:        Mood and Affect: Mood normal.        Behavior: Behavior normal.     Data Reviewed I reviewed the emergency department visit and [facility] clinic visit notes as discussed in the history of present illness.  Assessment This is a 59 year old man who is now had right upper quadrant pain since Monday.  He is exquisitely tender on exam with peritoneal signs and had a white blood cell count of nearly 20,000 when he was seen 2 days ago.  I believe he has acute cholecystitis and will need to be admitted to the hospital for further evaluation.  Given the duration of his symptoms, he will likely benefit most from a percutaneous cholecystostomy tube with interval cholecystectomy in the future.  Plan Unfortunately, due to a policy change, we are unable to direct admit patients from our office.  The patient was instructed to go to the  emergency department.  We have notified them of his imminent arrival and to contact our service immediately.  We will make the appropriate arrangements and have already spoken to interventional radiology regarding the likely need for a cholecystostomy tube.  He will be placed on IV antibiotics and given fluid resuscitation as indicated.  This has been discussed with my partner, Dr. Ernesto Rutherford, and are physicians assistant, Lynden Oxford, who have kindly agreed to facilitate his admission and treatment.    [name] 01/19/2020, 10:15 AM

## 2020-01-19 NOTE — Progress Notes (Signed)
01/19/20  Patient discussed with Dr. Lady Gary this morning.  Patient presented to ER on 12/14 with RUQ pain that started on 12/13 evening.  He went to [facility] and had labwork done showing a WBC of 19.6 and was advised to go to ER.  In the ER, he had an U/S which showed cholelithiasis with gallbladder wall thickening to 6 mm.  He reports that his pain eased off after getting medication in the ER, and he was discharged home.  Today he was seen by Dr. Lady Gary in our office and he was exquisitely tender in the RUQ with positive Murphy's sign and concerns for peritonitis.  Patient was sent to the ER for admission and treatment.    Patient reports that he has not eaten much since Monday because every time he tried to eat would worsen the RUQ pain.  On my exam, he's also quite tender, with positive Murphy's sign and jumping a bit off bed with palpation.  Discussed with Dr. Elby Showers with IR and will get a CT abdomen/pelvis to re-evaluate the gallbladder and for mapping purposes, with goal of percutaneous cholecystostomy tube placement today.  Discussed plan with patient and he's in agreement.  Henrene Dodge, MD

## 2020-01-19 NOTE — H&P (Addendum)
[facility] SURGICAL HISTORY & PHYSICAL (cpt [phone])  HPI Dean Dominguez is a 59 y.o. male.   He is here today as a referral from the emergency department.  He was seen there on Tuesday, 14 December.  He says that he initially thought he was having food poisoning from some old food that was left out, but after it did not abate, he went to the [place] clinic.  He had abdominal pain and nausea; labs obtained at [facility] clinic demonstrated a white blood cell count of 19.6 K.  He was sent to the emergency department.  Evaluation there included a right upper quadrant ultrasound that demonstrated significant gallbladder wall thickening, but no pericholecystic fluid.  The ultrasound report states no sonographic Murphy sign.  The patient received some antiemetics and pain medication and felt better, so he was sent home with instructions to follow-up in surgery clinic.  Today, he reports that his epigastric and right upper quadrant pain persists.  He is so tender that he is unable to apply any pressure to his right side.  Because of this, he has not been able to sleep much, needing to sleep sitting upright.  He continues to endorse nausea and has been having diarrhea.  He has not had any emesis.  He has not been able to eat, but states he has been able to sip on ginger ale.  He has never had a similar episode.  He has never had pancreatitis or jaundice.         Past Medical History:  Diagnosis Date   Arthritis      hands & knees   Depression     Dyspnea      DOE (pt denies 02/16/18)   Tobacco dependence             Past Surgical History:  Procedure Laterality Date   ANTERIOR CRUCIATE LIGAMENT REPAIR Right 1999   CATARACT EXTRACTION W/PHACO Left 04/29/2017    Procedure: CATARACT EXTRACTION PHACO AND INTRAOCULAR LENS PLACEMENT (IOC);  Surgeon: Galen Manila, MD;  Location: [facility];  Service: Ophthalmology;  Laterality: Left;  [place] 00:34 AP% 17.6 CDE 6.09 Fluid pak lot #  [id number]   CATARACT EXTRACTION W/PHACO Right 05/19/2017    Procedure: CATARACT EXTRACTION PHACO AND INTRAOCULAR LENS PLACEMENT (IOC);  Surgeon: Galen Manila, MD;  Location: [facility];  Service: Ophthalmology;  Laterality: Right;  [place] 00:30 AP% 14.6 CDE 4.51 Fluid pack lot # [id number]   METAL PLATES  left foot Left 2018   SHOULDER ARTHROSCOPY Left 02/18/2018    Procedure: ARTHROSCOPY SHOULDER WITH EXTENSIVE DEBRIDEMENT, ROTATOR CUFF REPAIR AND SUBACROMIAL DECOMPRESSION;  Surgeon: Signa Kell, MD;  Location: [facility];  Service: Orthopedics;  Laterality: Left;  ARTHREX SWIVELOCK 4.75 MM  OTHER ARTHROCARE WAND BARROL BURR OPEN SHID KOLBALL HOMANS COBBS   TENDON REPAIR Left 01/09/2017    Procedure: TENDON REPAIR-TIBIALIS TENDON;  Surgeon: Gwyneth Revels, DPM;  Location: [facility];  Service: Podiatry;  Laterality: Left;   TONSILLECTOMY       WISDOM TOOTH EXTRACTION   1978    2 wisdom teeth extracted           Family History  Problem Relation Age of Onset   Hypertension Mother     Healthy Father        Social History Social History         Tobacco Use   Smoking status: Current Every Day Smoker      Packs/day: 1.00  Years: 40.00      Pack years: 40.00      Types: Cigarettes   Smokeless tobacco: Former Neurosurgeon      Quit date: 02/03/1997  Vaping Use   Vaping Use: Never used  Substance Use Topics   Alcohol use: No      Comment: history of alcohol abuse, beer quit 10/09/2008   Drug use: Yes      Frequency: 2.0 times per week      Types: Marijuana      Comment: used 10 days ago      No Known Allergies         Current Outpatient Medications  Medication Sig Dispense Refill   acetaminophen (TYLENOL) 650 MG CR tablet Take by mouth.       fluticasone (FLONASE) 50 MCG/ACT nasal spray Place 1 spray into both nostrils daily.       losartan (COZAAR) 25 MG tablet Take 25 mg by mouth daily.       Multiple Vitamins-Minerals (MULTIVITAMIN PO) Take 1 tablet by mouth daily.        naproxen (NAPROSYN) 500 MG tablet Take 500 mg by mouth 2 (two) times daily.       ondansetron (ZOFRAN ODT) 4 MG disintegrating tablet Allow 1-2 tablets to dissolve in your mouth every 8 hours as needed for nausea/vomiting 30 tablet 0   TURMERIC PO Take by mouth.       venlafaxine XR (EFFEXOR-XR) 75 MG 24 hr capsule Take 300 mg by mouth at bedtime.    0   amoxicillin-clavulanate (AUGMENTIN) 875-125 MG tablet Take 1 tablet by mouth 2 (two) times daily for 14 days. 28 tablet 0   HYDROcodone-acetaminophen (NORCO/VICODIN) 5-325 MG tablet Take 1 tablet by mouth every 8 (eight) hours as needed. (Patient not taking: Reported on 01/19/2020) 10 tablet 0   ketorolac (TORADOL) 10 MG tablet Take 1 tablet (10 mg total) by mouth every 6 (six) hours as needed for moderate pain or severe pain. (Patient not taking: Reported on 01/19/2020) 20 tablet 0    No current facility-administered medications for this visit.      Review of Systems Review of Systems  Constitutional: Positive for appetite change, chills and fever.  HENT: Positive for hearing loss and tinnitus.   Cardiovascular: Positive for chest pain.  Gastrointestinal: Positive for diarrhea and nausea.  Psychiatric/Behavioral: Positive for dysphoric mood.  All other systems reviewed and are negative.     Blood pressure (!) 158/80, pulse 82, temperature 98.4 F (36.9 C), temperature source Oral, height 5\' 10"  (1.778 m), weight 167 lb 6.4 oz (75.9 kg), SpO2 98 %. Body mass index is 24.02 kg/m.   Physical Exam Physical Exam Constitutional:      General: He is not in acute distress.    Appearance: He is normal weight. He is ill-appearing.     Comments: He appears uncomfortable.  HENT:     Head: Normocephalic and atraumatic.     Nose:     Comments: Covered with a mask    Mouth/Throat:     Comments: Covered with a mask Eyes:     General: No scleral icterus.       Right eye: No discharge.        Left eye: No discharge.  Neck:     Comments: No  palpable cervical or supraclavicular lymphadenopathy.  The trachea is midline.  There is no thyromegaly nor any dominant thyroid mass.  The gland is freely with deglutition. Cardiovascular:  Rate and Rhythm: Normal rate and regular rhythm.     Pulses: Normal pulses.  Pulmonary:     Effort: Pulmonary effort is normal.     Breath sounds: Normal breath sounds.  Abdominal:     Palpations: Abdomen is soft.     Tenderness: There is abdominal tenderness. There is guarding and rebound.     Comments: He is acutely tender to even light palpation in the right upper quadrant and epigastrium.  Murphy sign is positive.  Genitourinary:    Comments: Deferred Musculoskeletal:        General: No swelling or tenderness.     Cervical back: No rigidity.  Lymphadenopathy:     Cervical: No cervical adenopathy.  Skin:    General: Skin is warm and dry.     Coloration: Skin is not jaundiced.  Neurological:     General: No focal deficit present.     Mental Status: He is alert and oriented to person, place, and time.  Psychiatric:        Mood and Affect: Mood normal.        Behavior: Behavior normal.        Data Reviewed I reviewed the emergency department visit and [facility] clinic visit notes as discussed in the history of present illness.   Assessment This is a 59 year old man who is now had right upper quadrant pain since Monday.  He is exquisitely tender on exam with peritoneal signs and had a white blood cell count of nearly 20,000 when he was seen 2 days ago.  I believe he has acute cholecystitis and will need to be admitted to the hospital for further evaluation.  Given the duration of his symptoms, he will likely benefit most from a percutaneous cholecystostomy tube with interval cholecystectomy in the future.   Plan Unfortunately, due to a policy change, we are unable to direct admit patients from our office.  The patient was instructed to go to the emergency department.  We have notified them  of his imminent arrival and to contact our service immediately.  We will make the appropriate arrangements and have already spoken to interventional radiology regarding the likely need for a cholecystostomy tube.  He will be placed on IV antibiotics and given fluid resuscitation as indicated.    -- Lynden Oxford, PA-C [facility] 01/19/2020, 11:50 AM [phone] M-F: 7am - 4pm  I saw and evaluated the patient.  I agree with the above documentation, exam, and plan, which I have edited where appropriate. [name]  12:22 PM

## 2020-01-19 NOTE — Procedures (Signed)
Interventional Radiology Procedure Note  Procedure: CT guided percutaneous cholecystostomy tube placement  Findings: Please refer to procedural dictation for full description.  Subcostal, transhepatic placement of 12 Fr cholecystostomy tube.  Hemobilia aspirate, sample sent for culture.  Complications: None immediate  Estimated Blood Loss: < 5 mL  Recommendations: Keep to bulb suction. Trend CBC. IR will follow.   Marliss Coots, MD Pager: [phone]

## 2020-01-19 NOTE — ED Provider Notes (Signed)
[facility] Emergency Department Provider Note   ____________________________________________   Event Date/Time   First MD Initiated Contact with Patient 01/19/20 1126     (approximate)  I have reviewed the triage vital signs and the nursing notes.   HISTORY  Chief Complaint Abdominal Pain    HPI Dean Dominguez is a 59 y.o. male we had a stated past medical history of hypertension and tobacco abuse who presents for right upper quadrant abdominal pain.  Patient states that he has been dealing with this pain over the last few weeks and was sent by his provider today for gallbladder removal.  Patient has elevated white blood cell count and is tender to palpation in the right upper quadrant.  Patient also endorses nausea without vomiting currently but has had many in the past.  Patient currently only describes aching, 2/10, right upper quadrant, nonradiating pain that is slightly worsened with p.o. intake.  Patient states last p.o. intake was at 3-4 PM yesterday.         Past Medical History:  Diagnosis Date  . Arthritis    hands & knees  . Depression   . Dyspnea    DOE (pt denies 02/16/18)  . Hypertension   . Tobacco dependence     Patient Active Problem List   Diagnosis Date Noted  . Acute cholecystitis 01/19/2020    Past Surgical History:  Procedure Laterality Date  . ANTERIOR CRUCIATE LIGAMENT REPAIR Right 1999  . CATARACT EXTRACTION W/PHACO Left 04/29/2017   Procedure: CATARACT EXTRACTION PHACO AND INTRAOCULAR LENS PLACEMENT (IOC);  Surgeon: Galen Manila, MD;  Location: [facility] ORS;  Service: Ophthalmology;  Laterality: Left;  [place] 00:34 AP% 17.6 CDE 6.09 Fluid pak lot # [id number]  . CATARACT EXTRACTION W/PHACO Right 05/19/2017   Procedure: CATARACT EXTRACTION PHACO AND INTRAOCULAR LENS PLACEMENT (IOC);  Surgeon: Galen Manila, MD;  Location: [facility] ORS;  Service: Ophthalmology;  Laterality: Right;  [place] 00:30 AP% 14.6 CDE 4.51 Fluid  pack lot # [id number]  . METAL PLATES  left foot Left 2018  . SHOULDER ARTHROSCOPY Left 02/18/2018   Procedure: ARTHROSCOPY SHOULDER WITH EXTENSIVE DEBRIDEMENT, ROTATOR CUFF REPAIR AND SUBACROMIAL DECOMPRESSION;  Surgeon: Signa Kell, MD;  Location: [facility];  Service: Orthopedics;  Laterality: Left;  ARTHREX SWIVELOCK 4.75 MM  OTHER ARTHROCARE WAND BARROL BURR OPEN SHID KOLBALL HOMANS COBBS  . TENDON REPAIR Left 01/09/2017   Procedure: TENDON REPAIR-TIBIALIS TENDON;  Surgeon: Gwyneth Revels, DPM;  Location: [facility] ORS;  Service: Podiatry;  Laterality: Left;  . TONSILLECTOMY    . WISDOM TOOTH EXTRACTION  1978   2 wisdom teeth extracted    Prior to Admission medications   Medication Sig Start Date End Date Taking? Authorizing Provider  acetaminophen (TYLENOL) 650 MG CR tablet Take by mouth.    [provider]  amoxicillin-clavulanate (AUGMENTIN) 875-125 MG tablet Take 1 tablet by mouth 2 (two) times daily for 14 days. 01/19/20 02/02/20  [name], MD  fluticasone (FLONASE) 50 MCG/ACT nasal spray Place 1 spray into both nostrils daily.    [provider]  HYDROcodone-acetaminophen (NORCO/VICODIN) 5-325 MG tablet Take 1 tablet by mouth every 8 (eight) hours as needed. Patient not taking: Reported on 01/19/2020 02/04/19   Tommie Sams, DO  ketorolac (TORADOL) 10 MG tablet Take 1 tablet (10 mg total) by mouth every 6 (six) hours as needed for moderate pain or severe pain. Patient not taking: Reported on 01/19/2020 02/04/19   Tommie Sams, DO  losartan (  COZAAR) 25 MG tablet Take 25 mg by mouth daily. 10/17/19   [provider]  Multiple Vitamins-Minerals (MULTIVITAMIN PO) Take 1 tablet by mouth daily.    [provider]  naproxen (NAPROSYN) 500 MG tablet Take 500 mg by mouth 2 (two) times daily. 01/09/20   [provider]  ondansetron (ZOFRAN ODT) 4 MG disintegrating tablet Allow 1-2 tablets to dissolve in your mouth every 8 hours as needed for  nausea/vomiting 01/18/20   Loleta Rose, MD  TURMERIC PO Take by mouth.    [provider]  venlafaxine XR (EFFEXOR-XR) 75 MG 24 hr capsule Take 300 mg by mouth at bedtime.  12/04/16   [provider]    Allergies Patient has no known allergies.  Family History  Problem Relation Age of Onset  . Hypertension Mother   . Healthy Father     Social History Social History   Tobacco Use  . Smoking status: Current Every Day Smoker    Packs/day: 1.00    Years: 40.00    Pack years: 40.00    Types: Cigarettes  . Smokeless tobacco: Former Neurosurgeon    Quit date: 02/03/1997  Vaping Use  . Vaping Use: Never used  Substance Use Topics  . Alcohol use: Yes    Alcohol/week: 6.0 standard drinks    Types: 6 Cans of beer per week  . Drug use: Yes    Frequency: 2.0 times per week    Types: Marijuana    Comment: used 10 days ago    Review of Systems Constitutional: No fever/chills Eyes: No visual changes. ENT: No sore throat. Cardiovascular: Denies chest pain. Respiratory: Denies shortness of breath. Gastrointestinal: Endorses abdominal pain.  Endorses nausea and vomiting.  No diarrhea. Genitourinary: Negative for dysuria. Musculoskeletal: Negative for acute arthralgias Skin: Negative for rash. Neurological: Negative for headaches, weakness/numbness/paresthesias in any extremity Psychiatric: Negative for suicidal ideation/homicidal ideation   ____________________________________________   PHYSICAL EXAM:  VITAL SIGNS: ED Triage Vitals  Enc Vitals Group     BP 01/19/20 1108 132/65     Pulse Rate 01/19/20 1108 92     Resp 01/19/20 1108 17     Temp 01/19/20 1108 (!) 97.4 F (36.3 C)     Temp Source 01/19/20 1108 Oral     SpO2 01/19/20 1108 95 %     Weight 01/19/20 1108 167 lb 6.4 oz (75.9 kg)     Height 01/19/20 1108 5\' 10"  (1.778 m)     Head Circumference --      Peak Flow --      Pain Score 01/19/20 1116 5     Pain Loc --      Pain Edu? --      Excl. in GC?  --    Constitutional: Alert and oriented. Well appearing and in no acute distress. Eyes: Conjunctivae are normal. PERRL. Head: Atraumatic. Nose: No congestion/rhinnorhea. Mouth/Throat: Mucous membranes are moist. Neck: No stridor Cardiovascular: Grossly normal heart sounds.  Good peripheral circulation. Respiratory: Normal respiratory effort.  No retractions. Gastrointestinal: Soft.  Mild right upper quadrant tenderness palpation. No distention. Musculoskeletal: No obvious deformities Neurologic:  Normal speech and language. No gross focal neurologic deficits are appreciated. Skin:  Skin is warm and dry. No rash noted. Psychiatric: Mood and affect are normal. Speech and behavior are normal.  ____________________________________________   LABS (all labs ordered are listed, but only abnormal results are displayed)  Labs Reviewed  CBC - Abnormal; Notable for the following components:  Result Value   WBC 12.3 (*)    RBC 4.20 (*)    MCV 100.5 (*)    All other components within normal limits  COMPREHENSIVE METABOLIC PANEL  PROTIME-INR  APTT   RADIOLOGY  ED MD interpretation: Ultrasound of the right upper quadrant shows gallbladder sludge and gallbladder stones with a thickened gallbladder wall  Official radiology report(s): No results found.  ____________________________________________   PROCEDURES  Procedure(s) performed (including Critical Care):  .1-3 Lead EKG Interpretation Performed by: Merwyn Katos, MD Authorized by: Merwyn Katos, MD     Interpretation: normal     ECG rate:  92   ECG rate assessment: normal     Rhythm: sinus rhythm     Ectopy: none     Conduction: normal       ____________________________________________   INITIAL IMPRESSION / ASSESSMENT AND PLAN / ED COURSE  As part of my medical decision making, I reviewed the following data within the electronic MEDICAL RECORD NUMBER Nursing notes reviewed and incorporated, Labs reviewed, EKG  interpreted, Old chart reviewed, Radiograph reviewed and Notes from prior ED visits reviewed and incorporated        Patient is a 59 year old male who presents for right upper quadrant abdominal pain History and exam AAA, pancreatitis, SBO, appendicitis, mesenteric ischemia, nephrolithiasis, pyelonephritis, or diverticulitis.  ED Interventions: analgesia PRN ED Workup: CBC, BMP, LFTs, Lipase, CT abdomen/pelvis ED Interventions: NPO with IVF ABX: Zosyn 3.375g iv  Disposition: Admit to General Surgery      ____________________________________________   FINAL CLINICAL IMPRESSION(S) / ED DIAGNOSES  Final diagnoses:  Cholecystitis     ED Discharge Orders    None       Note:  This document was prepared using Dragon voice recognition software and may include unintentional dictation errors.   Merwyn Katos, MD 01/19/20 1135

## 2020-01-19 NOTE — ED Notes (Signed)
Taken to ct  

## 2020-01-19 NOTE — OR Nursing (Signed)
Transferred to ED report given at bedside to nurse Lupita Leash. Pt received 4 mg Versed and 200 mcg IV fentanyl during chole tube insertion. Infusion of NS and Zosyn continues.

## 2020-01-19 NOTE — Patient Instructions (Addendum)
Cholecystitis  Cholecystitis is irritation and swelling (inflammation) of the gallbladder. The gallbladder is an organ that is shaped like a pear. It is under the liver on the right side of the body. This organ stores bile. Bile helps the body break down (digest) the fats in food. This condition can occur all of a sudden. It needs to be treated. What are the causes? This condition may be caused by stones or lumps that form in the gallbladder (gallstones). Gallstones can block the tube (duct) that carries bile out of your gallbladder. Other causes are:  Damage to the gallbladder due to less blood flow.  Germs in the bile ducts.  Scars or kinks in the bile ducts.  Abnormal growths (tumors) in the liver, pancreas, or gallbladder. What increases the risk? You are more likely to develop this condition if:  You have sickle cell disease.  You take birth control pills.  You use estrogen.  You have alcoholic liver disease.  You have liver cirrhosis.  You are being fed through a vein.  You are very ill.  You do not eat or drink for a long time. This is also called "fasting."  You are overweight (obese).  You lose weight too fast.  You are pregnant.  You have high levels of fat in the blood (triglycerides).  You have irritation and swelling of the pancreas (pancreatitis). What are the signs or symptoms? Symptoms of this condition include:  Pain in the belly (abdomen). Pain is often in the upper right area of the belly.  Tenderness or bloating in the belly.  Feeling sick to your stomach (nauseous).  Throwing up (vomiting).  Fever.  Chills. How is this diagnosed? This condition may be diagnosed with a medical history and exam. You may also have other tests, such as:  Imaging tests. This may include: ? Ultrasound. ? CT scan of the belly. ? Nuclear scan. This is also called a HIDA scan. This scan lets your doctor see the bile as it moves in your body. ? MRI.  Blood  tests. These are done to check: ? Your blood count. The white blood cell count may be higher than normal. ? How well your liver works. How is this treated? This condition may be treated with:  Surgery to take out your gallbladder.  Antibiotic medicines to treat illnesses caused by germs.  Going without food for some time.  Giving fluids through an IV tube.  Medicines to treat pain or throwing up. Follow these instructions at home:  If you had surgery, follow instructions from your doctor about how to care for yourself after you go home. Medicines   Take over-the-counter and prescription medicines only as told by your doctor.  If you were prescribed an antibiotic medicine, take it as told by your doctor. Do not stop taking it even if you start to feel better. General instructions  Follow instructions from your doctor about what to eat or drink. Do not eat or drink anything that makes you sick again.  Do not lift anything that is heavier than 10 lb (4.5 kg) until your doctor says that it is safe.  Do not use any products that contain nicotine or tobacco, such as cigarettes and e-cigarettes. If you need help quitting, ask your doctor.  Keep all follow-up visits as told by your doctor. This is important. Contact a doctor if:  You have pain and your medicine does not help.  You have a fever. Get help right away if:    Your pain moves to: ? Another part of your belly. ? Your back.  Your symptoms do not go away.  You have new symptoms. Summary  Cholecystitis is swelling and irritation of the gallbladder.  This condition may be caused by stones or lumps that form in the gallbladder (gallstones).  Common symptoms are pain in the belly. You may feel sick to your stomach and start throwing up. You may also have a fever and chills.  This condition may be treated with surgery to take out the gallbladder. It may also be treated with medicines, fasting, and fluids through an IV  tube.  Follow what you are told about eating and drinking. Do not eat things that make you sick again. This information is not intended to replace advice given to you by your health care provider. Make sure you discuss any questions you have with your health care provider. Document Revised: 05/29/2017 Document Reviewed: 05/29/2017 Elsevier Patient Education  2020 [employer]  

## 2020-01-19 NOTE — Consult Note (Signed)
Chief Complaint: Patient was seen in consultation today for acute calculous cholecystitis  Referring Physician(s): Henrene Dodge, MD  Patient Status: [facility] - In-pt  History of Present Illness: Dean Dominguez is a 59 y.o. male presenting with acute calculous cholecystitis.  Endorses chills without recorded fever, and RUQ abdominal pain.  Currently pain is under control.  Currently on Zosyn.  No chest pain, shortness of breath, vomiting, jaundice.   Past Medical History:  Diagnosis Date  . Arthritis    hands & knees  . Depression   . Dyspnea    DOE (pt denies 02/16/18)  . Hypertension   . Tobacco dependence     Past Surgical History:  Procedure Laterality Date  . ANTERIOR CRUCIATE LIGAMENT REPAIR Right 1999  . CATARACT EXTRACTION W/PHACO Left 04/29/2017   Procedure: CATARACT EXTRACTION PHACO AND INTRAOCULAR LENS PLACEMENT (IOC);  Surgeon: Galen Manila, MD;  Location: [facility] ORS;  Service: Ophthalmology;  Laterality: Left;  [place] 00:34 AP% 17.6 CDE 6.09 Fluid pak lot # [id number]  . CATARACT EXTRACTION W/PHACO Right 05/19/2017   Procedure: CATARACT EXTRACTION PHACO AND INTRAOCULAR LENS PLACEMENT (IOC);  Surgeon: Galen Manila, MD;  Location: [facility] ORS;  Service: Ophthalmology;  Laterality: Right;  [place] 00:30 AP% 14.6 CDE 4.51 Fluid pack lot # [id number]  . METAL PLATES  left foot Left 2018  . SHOULDER ARTHROSCOPY Left 02/18/2018   Procedure: ARTHROSCOPY SHOULDER WITH EXTENSIVE DEBRIDEMENT, ROTATOR CUFF REPAIR AND SUBACROMIAL DECOMPRESSION;  Surgeon: Signa Kell, MD;  Location: [facility];  Service: Orthopedics;  Laterality: Left;  ARTHREX SWIVELOCK 4.75 MM  OTHER ARTHROCARE WAND BARROL BURR OPEN SHID KOLBALL HOMANS COBBS  . TENDON REPAIR Left 01/09/2017   Procedure: TENDON REPAIR-TIBIALIS TENDON;  Surgeon: Gwyneth Revels, DPM;  Location: [facility] ORS;  Service: Podiatry;  Laterality: Left;  . TONSILLECTOMY    . WISDOM TOOTH EXTRACTION  1978   2 wisdom teeth extracted     Allergies: Patient has no known allergies.  Medications: Prior to Admission medications   Medication Sig Start Date End Date Taking? Authorizing Provider  acetaminophen (TYLENOL) 650 MG CR tablet Take by mouth.    [provider]  amoxicillin-clavulanate (AUGMENTIN) 875-125 MG tablet Take 1 tablet by mouth 2 (two) times daily for 14 days. 01/19/20 02/02/20  [name], MD  fluticasone (FLONASE) 50 MCG/ACT nasal spray Place 1 spray into both nostrils daily.    [provider]  HYDROcodone-acetaminophen (NORCO/VICODIN) 5-325 MG tablet Take 1 tablet by mouth every 8 (eight) hours as needed. Patient not taking: Reported on 01/19/2020 02/04/19   Tommie Sams, DO  ketorolac (TORADOL) 10 MG tablet Take 1 tablet (10 mg total) by mouth every 6 (six) hours as needed for moderate pain or severe pain. Patient not taking: Reported on 01/19/2020 02/04/19   Tommie Sams, DO  losartan (COZAAR) 25 MG tablet Take 25 mg by mouth daily. 10/17/19   [provider]  Multiple Vitamins-Minerals (MULTIVITAMIN PO) Take 1 tablet by mouth daily.    [provider]  naproxen (NAPROSYN) 500 MG tablet Take 500 mg by mouth 2 (two) times daily. 01/09/20   [provider]  ondansetron (ZOFRAN ODT) 4 MG disintegrating tablet Allow 1-2 tablets to dissolve in your mouth every 8 hours as needed for nausea/vomiting 01/18/20   Loleta Rose, MD  TURMERIC PO Take by mouth.    [provider]  venlafaxine XR (EFFEXOR-XR) 75 MG 24 hr capsule Take 300 mg by mouth at bedtime.  12/04/16  [provider]     Family History  Problem Relation Age of Onset  . Hypertension Mother   . Healthy Father     Social History   Socioeconomic History  . Marital status: Single    Spouse name: Not on file  . Number of children: Not on file  . Years of education: Not on file  . Highest education level: Not on file  Occupational History  . Not on file  Tobacco Use  .  Smoking status: Current Every Day Smoker    Packs/day: 1.00    Years: 40.00    Pack years: 40.00    Types: Cigarettes  . Smokeless tobacco: Former Neurosurgeon    Quit date: 02/03/1997  Vaping Use  . Vaping Use: Never used  Substance and Sexual Activity  . Alcohol use: Yes    Alcohol/week: 6.0 standard drinks    Types: 6 Cans of beer per week  . Drug use: Yes    Frequency: 2.0 times per week    Types: Marijuana    Comment: used 10 days ago  . Sexual activity: Not on file  Other Topics Concern  . Not on file  Social History Narrative  . Not on file   Social Determinants of Health   Financial Resource Strain: Not on file  Food Insecurity: Not on file  Transportation Needs: Not on file  Physical Activity: Not on file  Stress: Not on file  Social Connections: Not on file    Review of Systems: A 12 point ROS discussed and pertinent positives are indicated in the HPI above.  All other systems are negative.  Vital Signs: BP 132/65 (BP Location: Left Arm)   Pulse 92   Temp (!) 97.4 F (36.3 C) (Oral)   Resp 17   Ht 5\' 10"  (1.778 m)   Wt 75.9 kg   SpO2 95%   BMI 24.02 kg/m   Physical Exam  Imaging: RUQ 01/18/20   CT AP 01/19/20   Labs:  CBC: Recent Labs    01/19/20 1115  WBC 12.3*  HGB 14.1  HCT 42.2  PLT 345    COAGS: Recent Labs    01/19/20 1115  INR 1.0  APTT 30    BMP: Recent Labs    01/19/20 1115  NA 135  K 3.8  CL 96*  CO2 27  GLUCOSE 134*  BUN 24*  CALCIUM 8.6*  CREATININE 0.93  GFRNONAA >60    LIVER FUNCTION TESTS: Recent Labs    01/19/20 1115  BILITOT 0.7  AST 17  ALT 13  ALKPHOS 65  PROT 7.1  ALBUMIN 3.1*    TUMOR MARKERS: No results for input(s): AFPTM, CEA, CA199, CHROMGRNA in the last 8760 hours.  Assessment and Plan: 59 year old male with no pertinent comorbidities presenting with acute calculous cholecystitis.  At this time, surgery recommends staged management of cholecystostomy tube and interval  cholecystectomy after inflammation subsides.    Plan for CT guided cholecystostomy tube placement with moderate sedation.  Risks and benefits discussed with the patient including, but not limited to bleeding, infection, gallbladder perforation, bile leak, sepsis or even death.  All of the patient's questions were answered, patient is agreeable to proceed. Consent signed and in chart.    Thank you for this interesting consult.  I greatly enjoyed meeting KELII CHITTUM Dominguez and look forward to participating in their care.  A copy of this report was sent to the requesting provider on this date.  Electronically Signed: Bennie Dallas, MD 01/19/2020, 2:35 PM   I spent a total of 20 Minutes  in face to face in clinical consultation, greater than 50% of which was counseling/coordinating care for acute cholecystitis.

## 2020-01-20 ENCOUNTER — Encounter: Payer: Self-pay | Admitting: General Surgery

## 2020-01-20 LAB — CBC
HCT: 39.2 % (ref 39.0–52.0)
Hemoglobin: 13.5 g/dL (ref 13.0–17.0)
MCH: 34.4 pg — ABNORMAL HIGH (ref 26.0–34.0)
MCHC: 34.4 g/dL (ref 30.0–36.0)
MCV: 100 fL (ref 80.0–100.0)
Platelets: 345 10*3/uL (ref 150–400)
RBC: 3.92 MIL/uL — ABNORMAL LOW (ref 4.22–5.81)
RDW: 12.8 % (ref 11.5–15.5)
WBC: 8.4 10*3/uL (ref 4.0–10.5)
nRBC: 0 % (ref 0.0–0.2)

## 2020-01-20 LAB — BASIC METABOLIC PANEL
Anion gap: 12 (ref 5–15)
BUN: 16 mg/dL (ref 8–23)
CO2: 25 mmol/L (ref 22–32)
Calcium: 8.4 mg/dL — ABNORMAL LOW (ref 8.9–10.3)
Chloride: 101 mmol/L (ref 98–111)
Creatinine, Ser: 0.71 mg/dL (ref 0.61–1.24)
GFR, Estimated: >60 mL/min (ref >60)
Glucose, Bld: 130 mg/dL — ABNORMAL HIGH (ref 70–99)
Potassium: 3.9 mmol/L (ref 3.5–5.1)
Sodium: 138 mmol/L (ref 135–145)

## 2020-01-20 MED ORDER — IBUPROFEN 600 MG PO TABS: 600.0000 mg | ORAL_TABLET | Freq: Three times a day (TID) | ORAL | 0 refills | Status: DC | PRN

## 2020-01-20 MED ORDER — AMOXICILLIN-POT CLAVULANATE 875-125 MG PO TABS: 1.0000 | ORAL_TABLET | Freq: Two times a day (BID) | ORAL | 0 refills | Status: AC

## 2020-01-20 MED ORDER — SODIUM CHLORIDE 0.9% FLUSH: 5.0000 mL | Freq: Every day | INTRAVENOUS | 1 refills | Status: DC

## 2020-01-20 MED ORDER — OXYCODONE HCL 5 MG PO TABS: 5.0000 mg | ORAL_TABLET | Freq: Four times a day (QID) | ORAL | 0 refills | Status: DC | PRN

## 2020-01-20 NOTE — Discharge Summary (Signed)
[facility] SURGICAL DISCHARGE SUMMARY (cpt: [phone])  Patient ID: Dean Dominguez MRN: [id number] DOB/AGE: September 24, 1960 59 y.o.  Admit date: 01/19/2020 Discharge date: 01/20/2020  Discharge Diagnoses Patient Active Problem List   Diagnosis Date Noted  . Acute cholecystitis 01/19/2020    Consultants Interventional Radiology  Procedures 01/19/2020:  CT guided percutaneous cholecystostomy tube placement  HPI: Dean SNEDDEN IIIis a 59 y.o.male. He is here today as a referral from the emergency department. He was seen there on Tuesday, 14 December. He says that he initially thought he was having food poisoning from some old food that was left out, but after it did not abate, he went to the [place] clinic. He had abdominal pain and nausea; labs obtained at [facility] clinic demonstrated a white blood cell count of 19.6 K. He was sent to the emergency department. Evaluation there included a right upper quadrant ultrasound that demonstrated significant gallbladder wall thickening, but no pericholecystic fluid. The ultrasound report states no sonographic Murphy sign. The patient received some antiemetics and pain medication and felt better, so he was sent home with instructions to follow-up in surgery clinic. Today, he reports that his epigastric and right upper quadrant pain persists. He is so tender that he is unable to apply any pressure to his right side. Because of this, he has not been able to sleep much, needing to sleep sitting upright. He continues to endorse nausea and has been having diarrhea. He has not had any emesis. He has not been able to eat, but states he has been able to sip on ginger ale. He has never had a similar episode. He has never had pancreatitis or jaundice.   Hospital Course: Patient was admitted to general surgery and IR was consulted. He underwent CT guided percutaneous cholecystostomy tube placement on 12/16. He had improvement  in his leukocytosis and pain the following day. Advancement of patient's diet and ambulation were well-tolerated. The remainder of patient's hospital course was essentially unremarkable, and discharge planning was initiated accordingly with patient safely able to be discharged home with appropriate discharge instructions, antibiotics (Augmentin x10 days), pain control, and outpatient follow-up after all of his questions were answered to his expressed satisfaction.   Discharge Condition: Good   Physical Examination:  Constitutional: Well appearing male, NAD Pulmonary: Normal effort, no respiratory distress Gastrointestinal: Soft, soreness at drain site, non-distended, no rebound/guarding. Cholecystostomy in RUQ with bilious output Skin: Warm, dry, no juandice    Allergies as of 01/20/2020   No Known Allergies     Medication List    TAKE these medications   acetaminophen 650 MG CR tablet Commonly known as: TYLENOL Take by mouth.   amoxicillin-clavulanate 875-125 MG tablet Commonly known as: Augmentin Take 1 tablet by mouth 2 (two) times daily for 10 days.   fluticasone 50 MCG/ACT nasal spray Commonly known as: FLONASE Place 1 spray into both nostrils daily.   ibuprofen 600 MG tablet Commonly known as: ADVIL Take 1 tablet (600 mg total) by mouth every 8 (eight) hours as needed for fever, mild pain or moderate pain.   losartan 25 MG tablet Commonly known as: COZAAR Take 25 mg by mouth daily.   MULTIVITAMIN PO Take 1 tablet by mouth daily.   naproxen 500 MG tablet Commonly known as: NAPROSYN Take 500 mg by mouth 2 (two) times daily.   ondansetron 4 MG disintegrating tablet Commonly known as: Zofran ODT Allow 1-2 tablets to dissolve in your mouth every 8 hours as needed for  nausea/vomiting   oxyCODONE 5 MG immediate release tablet Commonly known as: Oxy IR/ROXICODONE Take 1 tablet (5 mg total) by mouth every 6 (six) hours as needed for severe pain or breakthrough  pain.   sodium chloride flush 0.9 % Soln Commonly known as: NS 5 mLs by Intracatheter route daily.   TURMERIC PO Take by mouth.   venlafaxine XR 75 MG 24 hr capsule Commonly known as: EFFEXOR-XR Take 75 mg by mouth at bedtime.         Follow-up Information    [name], MD. Schedule an appointment as soon as possible for a visit in 3 week(s).   Specialty: General Surgery Why: Hospital follow up, cholecystitis, has drain  Contact information: [address] [place] [address] [phone]                Time spent on discharge management including discussion of hospital course, clinical condition, outpatient instructions, prescriptions, and follow up with the patient and members of the medical team: >30 minutes  -- Lynden Oxford , PA-C [facility]  01/20/2020, 1:41 PM [phone] M-F: 7am - 4pm

## 2020-01-20 NOTE — Plan of Care (Signed)
Continuing with plan of care. 

## 2020-01-20 NOTE — Plan of Care (Signed)
Discharge teaching completed with patient including maintenance and care of JP drain; patient is in stable condition.

## 2020-01-21 ENCOUNTER — Emergency Department
Admission: EM | Admit: 2020-01-21 | Discharge: 2020-01-21 | Disposition: A | Discharge status: Home / Self Care | Payer: 59 | Attending: Emergency Medicine | Admitting: Emergency Medicine

## 2020-01-21 ENCOUNTER — Other Ambulatory Visit: Payer: Self-pay

## 2020-01-21 ENCOUNTER — Encounter: Payer: Self-pay | Admitting: Emergency Medicine

## 2020-01-21 DIAGNOSIS — K9423 Gastrostomy malfunction: Secondary | ICD-10-CM | POA: Insufficient documentation

## 2020-01-21 DIAGNOSIS — I10 Essential (primary) hypertension: Secondary | ICD-10-CM | POA: Insufficient documentation

## 2020-01-21 DIAGNOSIS — L24A9 Irritant contact dermatitis due friction or contact with other specified body fluids: Secondary | ICD-10-CM

## 2020-01-21 DIAGNOSIS — F17210 Nicotine dependence, cigarettes, uncomplicated: Secondary | ICD-10-CM | POA: Insufficient documentation

## 2020-01-21 NOTE — ED Notes (Signed)
Patient reports that he has not taken his BP medications in 2 weeks

## 2020-01-21 NOTE — ED Triage Notes (Addendum)
Pt arrived via POV to ED with c/o JP drain leakage. Pt states drain was placed Thursday 01/19/20. Pt denies any pain at this time.    This RN visualized small amount of dark drainage in bulb and dark spot of dried drainage on pt pants at this time.   This RN and Sam RN visualized leaking coming from the connection of the tubing. Dressing placed securely around connection point at this time

## 2020-01-21 NOTE — ED Notes (Signed)
Patient had JP drain inserted by IR 2 days ago.  CO drain leaking.  >10 mL in JP drain.  Denies pain.  Drain leaking is his only complaint

## 2020-01-21 NOTE — Discharge Instructions (Addendum)
Please seek medical attention for any high fevers, chest pain, shortness of breath, change in behavior, persistent vomiting, bloody stool or any other new or concerning symptoms.  

## 2020-01-21 NOTE — ED Provider Notes (Signed)
[facility] Emergency Department Provider Note   ____________________________________________   I have reviewed the triage vital signs and the nursing notes.   HISTORY  Chief Complaint Drainage Tube Leak   History and physical exam limited by: Not cooperative   HPI Dean Dominguez is a 59 y.o. male who presents to the emergency department today because of concern for leakage from his JP drain. The patient states that it was leaking before he was even discharged from the hospital. The patient states that it has continued to leak. When the patient woke up today he states he had bile all over himself and his bed. The patient is quite upset and frustrated at this situation. He did not try contacting the surgeon on call.    Records reviewed. Per medical record review patient has a history of recent admission with discharge yesterday. Had   Past Medical History:  Diagnosis Date  . Arthritis    hands & knees  . Depression   . Dyspnea    DOE (pt denies 02/16/18)  . Hypertension   . Tobacco dependence     Patient Active Problem List   Diagnosis Date Noted  . Acute cholecystitis 01/19/2020    Past Surgical History:  Procedure Laterality Date  . ANTERIOR CRUCIATE LIGAMENT REPAIR Right 1999  . CATARACT EXTRACTION W/PHACO Left 04/29/2017   Procedure: CATARACT EXTRACTION PHACO AND INTRAOCULAR LENS PLACEMENT (IOC);  Surgeon: Galen Manila, MD;  Location: [facility] ORS;  Service: Ophthalmology;  Laterality: Left;  [place] 00:34 AP% 17.6 CDE 6.09 Fluid pak lot # [id number]  . CATARACT EXTRACTION W/PHACO Right 05/19/2017   Procedure: CATARACT EXTRACTION PHACO AND INTRAOCULAR LENS PLACEMENT (IOC);  Surgeon: Galen Manila, MD;  Location: [facility] ORS;  Service: Ophthalmology;  Laterality: Right;  [place] 00:30 AP% 14.6 CDE 4.51 Fluid pack lot # [id number]  . METAL PLATES  left foot Left 2018  . SHOULDER ARTHROSCOPY Left 02/18/2018   Procedure: ARTHROSCOPY SHOULDER WITH  EXTENSIVE DEBRIDEMENT, ROTATOR CUFF REPAIR AND SUBACROMIAL DECOMPRESSION;  Surgeon: Signa Kell, MD;  Location: [facility];  Service: Orthopedics;  Laterality: Left;  ARTHREX SWIVELOCK 4.75 MM  OTHER ARTHROCARE WAND BARROL BURR OPEN SHID KOLBALL HOMANS COBBS  . TENDON REPAIR Left 01/09/2017   Procedure: TENDON REPAIR-TIBIALIS TENDON;  Surgeon: Gwyneth Revels, DPM;  Location: [facility] ORS;  Service: Podiatry;  Laterality: Left;  . TONSILLECTOMY    . WISDOM TOOTH EXTRACTION  1978   2 wisdom teeth extracted    Prior to Admission medications   Medication Sig Start Date End Date Taking? Authorizing Provider  acetaminophen (TYLENOL) 650 MG CR tablet Take by mouth.    [provider]  amoxicillin-clavulanate (AUGMENTIN) 875-125 MG tablet Take 1 tablet by mouth 2 (two) times daily for 10 days. 01/20/20 01/30/20  Donovan Kail, PA-C  fluticasone (FLONASE) 50 MCG/ACT nasal spray Place 1 spray into both nostrils daily.    [provider]  ibuprofen (ADVIL) 600 MG tablet Take 1 tablet (600 mg total) by mouth every 8 (eight) hours as needed for fever, mild pain or moderate pain. 01/20/20   Donovan Kail, PA-C  losartan (COZAAR) 25 MG tablet Take 25 mg by mouth daily. 10/17/19   [provider]  Multiple Vitamins-Minerals (MULTIVITAMIN PO) Take 1 tablet by mouth daily.    [provider]  naproxen (NAPROSYN) 500 MG tablet Take 500 mg by mouth 2 (two) times daily. 01/09/20   [provider]  ondansetron (ZOFRAN ODT) 4 MG disintegrating tablet  Allow 1-2 tablets to dissolve in your mouth every 8 hours as needed for nausea/vomiting 01/18/20   Loleta Rose, MD  oxyCODONE (OXY IR/ROXICODONE) 5 MG immediate release tablet Take 1 tablet (5 mg total) by mouth every 6 (six) hours as needed for severe pain or breakthrough pain. 01/20/20   Donovan Kail, PA-C  sodium chloride flush (NS) 0.9 % SOLN 5 mLs by Intracatheter route daily. 01/20/20   Donovan Kail,  PA-C  TURMERIC PO Take by mouth. Patient not taking: Reported on 01/20/2020    [provider]  venlafaxine XR (EFFEXOR-XR) 75 MG 24 hr capsule Take 75 mg by mouth at bedtime. 12/04/16   [provider]    Allergies Patient has no known allergies.  Family History  Problem Relation Age of Onset  . Hypertension Mother   . Healthy Father     Social History Social History   Tobacco Use  . Smoking status: Current Every Day Smoker    Packs/day: 1.00    Years: 40.00    Pack years: 40.00    Types: Cigarettes  . Smokeless tobacco: Former Neurosurgeon    Quit date: 02/03/1997  Vaping Use  . Vaping Use: Never used  Substance Use Topics  . Alcohol use: Yes    Alcohol/week: 6.0 standard drinks    Types: 6 Cans of beer per week  . Drug use: Yes    Frequency: 2.0 times per week    Types: Marijuana    Comment: used 10 days ago    Review of Systems Unable to obtain secondary to patient's uncooperativeness ____________________________________________   PHYSICAL EXAM: Physical exam limited secondary to patient refusing.   VITAL SIGNS: ED Triage Vitals  Enc Vitals Group     BP 01/21/20 1223 (!) 135/92     Pulse Rate 01/21/20 1223 71     Resp 01/21/20 1223 18     Temp 01/21/20 1223 98.1 F (36.7 C)     Temp Source 01/21/20 1223 Oral     SpO2 01/21/20 1223 96 %     Weight 01/21/20 1224 167 lb 6.4 oz (75.9 kg)     Height 01/21/20 1224 5\' 10"  (1.778 m)     Head Circumference --      Peak Flow --      Pain Score 01/21/20 1223 0   Constitutional: Alert and oriented.  Eyes: Conjunctivae are normal.  ENT      Head: Normocephalic      Mouth/Throat: Mucous membranes are moist. Respiratory: Normal respiratory effort without tachypnea nor retractions.  Gastrointestinal: JP drain in place.  Genitourinary: Deferred Musculoskeletal: Normal range of motion in all extremities.  Skin:  Skin is warm, dry and intact. No rash noted. Psychiatric:  Frustrated.  ____________________________________________    LABS (pertinent positives/negatives)  None  ____________________________________________   EKG  None  ____________________________________________    RADIOLOGY  None  ____________________________________________   PROCEDURES  Procedures  ____________________________________________   INITIAL IMPRESSION / ASSESSMENT AND PLAN / ED COURSE  Pertinent labs & imaging results that were available during my care of the patient were reviewed by me and considered in my medical decision making (see chart for details).   Patient presented to the emergency department today because of concern for leaking from his cholecystostomy tube. Appears to be coming from the hub. Discussed with IR. Will try placing some dermabond around connection of the hub (not the leur lock). IR will reach out to patient Monday for possible replacement.  ____________________________________________   FINAL CLINICAL IMPRESSION(S) / ED DIAGNOSES  Final diagnoses:  Drainage from wound     Note: This dictation was prepared with Dragon dictation. Any transcriptional errors that result from this process are unintentional     Phineas Semen, MD 01/21/20 1704

## 2020-01-23 ENCOUNTER — Telehealth: Payer: Self-pay | Admitting: *Deleted

## 2020-01-23 NOTE — Telephone Encounter (Signed)
Spoke with pt and he states that he went to the ER on 12/18 due drainage leaking. He was advised to push saline and when pt went to insert saline, it would not go in as if there was a blockage, per pt. The ER then advised him to put glue on it and it did not work. Pt states it is not working at all now. Pt has an appt on 12/21 to come in to see Dr. Lady Gary. I advised pt to keep that appt. Pt needs a work note stating why he has been out for a week.

## 2020-01-23 NOTE — Telephone Encounter (Signed)
Patient called and stated that he was seen in ER on 01/21/20 due to his cholecystostomy tube not working and leaking. Patient is frustrated and is upset because he was suppose to get a call from some where he said about getting this fixed. Please call and adivse

## 2020-01-24 ENCOUNTER — Encounter: Payer: Self-pay | Admitting: General Surgery

## 2020-01-24 ENCOUNTER — Telehealth: Payer: Self-pay

## 2020-01-24 DIAGNOSIS — K810 Acute cholecystitis: Secondary | ICD-10-CM

## 2020-01-24 LAB — AEROBIC/ANAEROBIC CULTURE W GRAM STAIN (SURGICAL/DEEP WOUND): Culture: NO GROWTH

## 2020-01-24 NOTE — Telephone Encounter (Signed)
-----   Message from [name], MD sent at 01/24/2020 12:07 PM EST ----- Regarding: needs IR, not clinic This patient is apparently coming to see me today because his drain is leaking.  This is something that needs to be taken care of by IR.  There is no reason for him to come here today, as I did not operate on him and he was only discharged from the hospital on Friday.  Can we work on getting him set up with IR for his drain? This is what it looks like the plan was supposed to be when he showed up in the ED on 12/18. Thank you!

## 2020-01-24 NOTE — Telephone Encounter (Signed)
Patient will follow up here in about 4 weeks.

## 2020-01-24 NOTE — Telephone Encounter (Signed)
Spoke with IR and they had just gotten off of the phone with the patient. They will see him this Thursday for a tube exchange. They said that he is ok to wait until then. Patient informed that he doesn't need to come to his appointment today with Dr Lady Gary as there is nothing she can do with his tube. He is aware that IR will be calling him back to day to go over more information for Thursday.

## 2020-01-26 ENCOUNTER — Other Ambulatory Visit: Payer: Self-pay

## 2020-01-26 ENCOUNTER — Ambulatory Visit
Admission: RE | Admit: 2020-01-26 | Discharge: 2020-01-26 | Disposition: A | Discharge status: Home / Self Care | Payer: 59 | Source: Ambulatory Visit | Attending: General Surgery | Admitting: General Surgery

## 2020-01-26 DIAGNOSIS — Z4803 Encounter for change or removal of drains: Secondary | ICD-10-CM | POA: Insufficient documentation

## 2020-01-26 DIAGNOSIS — K810 Acute cholecystitis: Secondary | ICD-10-CM | POA: Diagnosis not present

## 2020-01-26 HISTORY — PX: IR EXCHANGE BILIARY DRAIN: IMG6046

## 2020-01-26 MED ORDER — IODIXANOL 320 MG/ML IV SOLN
50.0000 mL | Freq: Once | INTRAVENOUS | Status: AC
  Administered 2020-01-26: 09:00:00 10 mL

## 2020-01-26 NOTE — Discharge Instructions (Signed)
Biliary Drainage Catheter Placement, Care After °This sheet gives you information about how to care for yourself after your procedure. Your health care provider may also give you more specific instructions. If you have problems or questions, contact your health care provider. °What can I expect after the procedure? °After the procedure, it is common to have: °· Pain or soreness at the catheter insertion site. °· Tiredness and sleepiness for several hours. °· Some bruising at the catheter insertion site. °· Drainage into the collection bag on the outside of your body, if you have an external drainage catheter. °? You might see bloody discharge in the bag for the first 1 or 2 days. °? Then, the discharge should turn a yellow-green color. °Follow these instructions at home: °Medicines ° °· Take over-the-counter and prescription medicines for pain, discomfort, or fever only as told by your health care provider. °· Do not take aspirin or blood thinners unless your health care provider says that you can. These can make bleeding worse. °· Do not drive or use heavy machinery while taking prescription pain medicine. °Catheter insertion site care °· Clean the catheter insertion site as told by your health care provider. °· Do not take baths, swim, or use a hot tub until your health care provider approves. °· Take showers only. Before showering, cover the catheter insertion area with a watertight covering to keep the area dry. °· Keep the skin around the catheter insertion site dry. If the area gets wet, dry the skin completely. °· Check your catheter insertion site every day for signs of infection. Check for: °? Redness, swelling, or pain. °? Fluid or blood. °? Warmth. °? Pus or a bad smell. °General instructions ° °· Rest for the remainder of the day. °· Do not drive, use machinery, or make legal decisions for 24 hours after your procedure. °· Resume your usual diet. Avoid alcoholic beverages for 24 hours after your  procedure. °· Keep all follow-up visits as told by your health care provider. This is important. °· Drink enough fluid to keep your urine clear or pale yellow. °Contact a health care provider if: °· Your pain gets worse after it had improved, and it is not relieved with pain medicines. °· You have any questions about caring for your drainage catheter or collection bag. °· You have any of these around your catheter insertion site or coming from it: °? Skin breakdown. °? Redness, swelling, or pain. °? Fluid or blood. °? Warmth to the touch. °? Pus or a bad smell. °Get help right away if: °· You have a fever or chills. °· Your redness, swelling, or pain at the catheter insertion site gets worse, even though you are cleaning it well. °· You have leakage of bile around the drainage catheter. °· Your drainage catheter becomes blocked or clogged. °· Your drainage catheter comes out. °This information is not intended to replace advice given to you by your health care provider. Make sure you discuss any questions you have with your health care provider. °Document Revised: 01/02/2017 Document Reviewed: 12/10/2015 °Elsevier Patient Education © 2020 [employer] Inc[employer] ° °

## 2020-02-07 ENCOUNTER — Telehealth: Payer: Self-pay | Admitting: General Surgery

## 2020-02-07 NOTE — Telephone Encounter (Signed)
Patient states he would like to return to work. He stated he hasn't been back to work since having the drain inserted. His employer is ok with him returning as long as he has a note stating he can work.

## 2020-02-07 NOTE — Telephone Encounter (Signed)
Patient is calling and is needing a letter for him to return to work tomorrow and if he would have any restrictions. Please call patient and let him know when he can come by and pick up his letter.

## 2020-02-13 ENCOUNTER — Other Ambulatory Visit
Admission: RE | Admit: 2020-02-13 | Discharge: 2020-02-13 | Disposition: A | Discharge status: Home / Self Care | Payer: 59 | Source: Ambulatory Visit | Attending: Surgery | Admitting: Surgery

## 2020-02-13 ENCOUNTER — Telehealth: Payer: Self-pay

## 2020-02-13 ENCOUNTER — Ambulatory Visit
Admission: RE | Admit: 2020-02-13 | Discharge: 2020-02-13 | Disposition: A | Discharge status: Home / Self Care | Payer: 59 | Source: Ambulatory Visit | Attending: Surgery | Admitting: Surgery

## 2020-02-13 ENCOUNTER — Ambulatory Visit (INDEPENDENT_AMBULATORY_CARE_PROVIDER_SITE_OTHER): Payer: 59 | Admitting: Surgery

## 2020-02-13 ENCOUNTER — Other Ambulatory Visit: Payer: Self-pay

## 2020-02-13 DIAGNOSIS — K810 Acute cholecystitis: Secondary | ICD-10-CM

## 2020-02-13 LAB — CBC WITH DIFFERENTIAL/PLATELET
Abs Immature Granulocytes: 0.05 10*3/uL (ref 0.00–0.07)
Basophils Absolute: 0.1 10*3/uL (ref 0.0–0.1)
Basophils Relative: 1 %
Eosinophils Absolute: 0.4 10*3/uL (ref 0.0–0.5)
Eosinophils Relative: 3 %
HCT: 36.5 % — ABNORMAL LOW (ref 39.0–52.0)
Hemoglobin: 12.3 g/dL — ABNORMAL LOW (ref 13.0–17.0)
Immature Granulocytes: 0 %
Lymphocytes Relative: 9 %
Lymphs Abs: 1.1 10*3/uL (ref 0.7–4.0)
MCH: 33.8 pg (ref 26.0–34.0)
MCHC: 33.7 g/dL (ref 30.0–36.0)
MCV: 100.3 fL — ABNORMAL HIGH (ref 80.0–100.0)
Monocytes Absolute: 1.1 10*3/uL — ABNORMAL HIGH (ref 0.1–1.0)
Monocytes Relative: 8 %
Neutro Abs: 10.3 10*3/uL — ABNORMAL HIGH (ref 1.7–7.7)
Neutrophils Relative %: 79 %
Platelets: 364 10*3/uL (ref 150–400)
RBC: 3.64 MIL/uL — ABNORMAL LOW (ref 4.22–5.81)
RDW: 12.8 % (ref 11.5–15.5)
WBC: 13.1 10*3/uL — ABNORMAL HIGH (ref 4.0–10.5)
nRBC: 0 % (ref 0.0–0.2)

## 2020-02-13 LAB — COMPREHENSIVE METABOLIC PANEL
ALT: 8 U/L (ref 0–44)
AST: 12 U/L — ABNORMAL LOW (ref 15–41)
Albumin: 3.2 g/dL — ABNORMAL LOW (ref 3.5–5.0)
Alkaline Phosphatase: 72 U/L (ref 38–126)
Anion gap: 12 (ref 5–15)
BUN: 18 mg/dL (ref 8–23)
CO2: 26 mmol/L (ref 22–32)
Calcium: 8.6 mg/dL — ABNORMAL LOW (ref 8.9–10.3)
Chloride: 96 mmol/L — ABNORMAL LOW (ref 98–111)
Creatinine, Ser: 0.86 mg/dL (ref 0.61–1.24)
GFR, Estimated: >60 mL/min (ref >60)
Glucose, Bld: 94 mg/dL (ref 70–99)
Potassium: 3.8 mmol/L (ref 3.5–5.1)
Sodium: 134 mmol/L — ABNORMAL LOW (ref 135–145)
Total Bilirubin: 0.6 mg/dL (ref 0.3–1.2)
Total Protein: 6.7 g/dL (ref 6.5–8.1)

## 2020-02-13 MED ORDER — SULFAMETHOXAZOLE-TRIMETHOPRIM 400-80 MG PO TABS: 1.0000 | ORAL_TABLET | Freq: Two times a day (BID) | ORAL | 0 refills | Status: AC

## 2020-02-13 MED ORDER — IOHEXOL 300 MG/ML  SOLN
100.0000 mL | Freq: Once | INTRAMUSCULAR | Status: AC | PRN
  Administered 2020-02-13: 100 mL via INTRAVENOUS

## 2020-02-13 NOTE — Progress Notes (Signed)
Outpatient Surgical Follow Up  02/13/2020  Dean Dominguez is an 60 y.o. male.   CC hx cholecystitis  HPI: Dean Dominguez is a 60 year old male well-known to our practice and is a patient of Dr. Lady Gary that actually had an nurse visit today.  Ms. Merlene Morse noticed some purulent drainage on the cholecystostomy tube.  Briefly a 60 year old and was evaluated last month for acute cholecystitis he was urgently admitted to the hospital and a cholecystostomy tube was placed under CT guidance.  He did well and was discharged a couple of days after that on antibiotics.  He now comes with pain in the right upper quadrant that is intermittent sharp in nature moderate intensity.  He also describes some purulent drainage from the cholecystostomy tube.  Of note he recently had a cholecystostomy tube exchange 2 and half weeks ago due to cholecystostomy tube draining around the insertion site. He has been tolerating p.o. and denies any fevers.  Please note that I have personally reviewed his CT scan as well as his scopic images and the laparoscopic images shows cholecystostomy tube in adequate position.  Most recent lab work to include a CBC 3 weeks ago was normal.  Past Medical History:  Diagnosis Date  . Arthritis    hands & knees  . Depression   . Dyspnea    DOE (pt denies 02/16/18)  . Hypertension   . Tobacco dependence     Past Surgical History:  Procedure Laterality Date  . ANTERIOR CRUCIATE LIGAMENT REPAIR Right 1999  . CATARACT EXTRACTION W/PHACO Left 04/29/2017   Procedure: CATARACT EXTRACTION PHACO AND INTRAOCULAR LENS PLACEMENT (IOC);  Surgeon: Galen Manila, MD;  Location: [facility] ORS;  Service: Ophthalmology;  Laterality: Left;  [place] 00:34 AP% 17.6 CDE 6.09 Fluid pak lot # [id number]  . CATARACT EXTRACTION W/PHACO Right 05/19/2017   Procedure: CATARACT EXTRACTION PHACO AND INTRAOCULAR LENS PLACEMENT (IOC);  Surgeon: Galen Manila, MD;  Location: [facility] ORS;  Service: Ophthalmology;   Laterality: Right;  [place] 00:30 AP% 14.6 CDE 4.51 Fluid pack lot # [id number]  . IR EXCHANGE BILIARY DRAIN  01/26/2020  . METAL PLATES  left foot Left 2018  . SHOULDER ARTHROSCOPY Left 02/18/2018   Procedure: ARTHROSCOPY SHOULDER WITH EXTENSIVE DEBRIDEMENT, ROTATOR CUFF REPAIR AND SUBACROMIAL DECOMPRESSION;  Surgeon: Signa Kell, MD;  Location: [facility];  Service: Orthopedics;  Laterality: Left;  ARTHREX SWIVELOCK 4.75 MM  OTHER ARTHROCARE WAND BARROL BURR OPEN SHID KOLBALL HOMANS COBBS  . TENDON REPAIR Left 01/09/2017   Procedure: TENDON REPAIR-TIBIALIS TENDON;  Surgeon: Gwyneth Revels, DPM;  Location: [facility] ORS;  Service: Podiatry;  Laterality: Left;  . TONSILLECTOMY    . WISDOM TOOTH EXTRACTION  1978   2 wisdom teeth extracted    Family History  Problem Relation Age of Onset  . Hypertension Mother   . Healthy Father     Social History:  reports that he has been smoking cigarettes. He has a 40.00 pack-year smoking history. He quit smokeless tobacco use about 23 years ago. He reports current alcohol use of about 6.0 standard drinks of alcohol per week. He reports current drug use. Frequency: 2.00 times per week. Drug: Marijuana.  Allergies: No Known Allergies  Medications reviewed.    ROS Full ROS performed and is otherwise negative other than what is stated in HPI   There were no vitals taken for this visit.  Physical Exam Vitals and nursing note reviewed.  Constitutional:      Appearance: Normal appearance. He is normal  weight. He is not toxic-appearing.  Eyes:     General: No scleral icterus.       Right eye: No discharge.        Left eye: No discharge.  Pulmonary:     Effort: Pulmonary effort is normal. No respiratory distress.     Breath sounds: Normal breath sounds. No stridor.  Abdominal:     General: Abdomen is flat. There is no distension.     Palpations: Abdomen is soft. There is no mass.     Tenderness: There is abdominal tenderness.     Hernia: No  hernia is present.     Comments: There is a cholecystostomy tube in place with purulent drainage within the back.  There is also tenderness around drain site.  There is evidence of surrounding cellulitis but there is no evidence of drainable collections.  There is no evidence of peritonitis  Musculoskeletal:        General: Normal range of motion.  Skin:    General: Skin is warm and dry.     Capillary Refill: Capillary refill takes less than 2 seconds.  Neurological:     General: No focal deficit present.     Mental Status: He is alert and oriented to person, place, and time.  Psychiatric:        Mood and Affect: Mood normal.        Behavior: Behavior normal.        Thought Content: Thought content normal.        Judgment: Judgment normal.       Assessment/Plan:  Cellulitis and purulent draiange from cholecystostomy tube.  I would like to make sure that there is no other intra-abdominal collections or liver abscesses that would explain the purulent drainage from the patient's cholecystostomy tube.  We will start empirically on Bactrim since I do think that there is significant cellulitis around drain site and this might be the source of infection.  I will obtain an urgent CT scan of the abdomen and pelvis as well as some basic labs to include a CBC and CMP.  He will have a short follow-up with Dr. Lady Gary this week.  She does not seem toxic or in need for admission at this time Greater than 50% of the 40 minutes  visit was spent in counseling/coordination of care   Sterling Big, MD [facility] General Surgeon

## 2020-02-13 NOTE — Patient Instructions (Addendum)
Please see your follow up appointment listed below. Pick up your Medication at the pharmacy today after your CT scan is done.   Please go directly to [facility] entrance to  have your CT scan done today.

## 2020-02-13 NOTE — Telephone Encounter (Signed)
LVM for pt to call the office.

## 2020-02-13 NOTE — Progress Notes (Signed)
  Patient notified of lab work and will have it done after CT scan today. CBC and CMP

## 2020-02-13 NOTE — Addendum Note (Signed)
Addended by: Sterling Big F on: 02/13/2020 01:25 PM   Modules accepted: Level of Service

## 2020-02-15 ENCOUNTER — Telehealth: Payer: Self-pay

## 2020-02-15 NOTE — Telephone Encounter (Signed)
2nd attempt to reach pt for CT results. Advised to call office.

## 2020-02-15 NOTE — Telephone Encounter (Signed)
Per Dr.Pabon Catheter well placed-keep scheduled appointment with [facility] 02/16/2020.

## 2020-02-16 ENCOUNTER — Other Ambulatory Visit: Payer: Self-pay

## 2020-02-16 ENCOUNTER — Ambulatory Visit (INDEPENDENT_AMBULATORY_CARE_PROVIDER_SITE_OTHER): Payer: 59 | Admitting: General Surgery

## 2020-02-16 ENCOUNTER — Telehealth: Payer: Self-pay | Admitting: General Surgery

## 2020-02-16 ENCOUNTER — Encounter: Payer: Self-pay | Admitting: General Surgery

## 2020-02-16 VITALS — BP 147/82 | HR 69 | Temp 98.3°F | Ht 70.0 in | Wt 166.0 lb

## 2020-02-16 DIAGNOSIS — K810 Acute cholecystitis: Secondary | ICD-10-CM

## 2020-02-16 NOTE — Patient Instructions (Addendum)
Continue to flush your drain 1-2 times a day. We will be able to take your gallbladder out in about 3 weeks.   You have no dietary restrictions at this time.   You have requested to have your gallbladder removed. This will be done at [facility] with Dr. Lady Gary.  You will most likely be out of work 1-2 weeks for this surgery. You will return after your post-op appointment with a lifting restriction for approximately 4 more weeks.  You will be able to eat anything you would like to following surgery. But, start by eating a bland diet and advance this as tolerated. The Gallbladder diet is below, please go as closely by this diet as possible prior to surgery to avoid any further attacks.  Please see the (blue)pre-care form that you have been given today. If you have any questions, please call our office. Our surgery scheduler will call you to look at surgery dates and to go over information.   Laparoscopic Cholecystectomy Laparoscopic cholecystectomy is surgery to remove the gallbladder. The gallbladder is located in the upper right part of the abdomen, behind the liver. It is a storage sac for bile, which is produced in the liver. Bile aids in the digestion and absorption of fats. Cholecystectomy is often done for inflammation of the gallbladder (cholecystitis). This condition is usually caused by a buildup of gallstones (cholelithiasis) in the gallbladder. Gallstones can block the flow of bile, and that can result in inflammation and pain. In severe cases, emergency surgery may be required. If emergency surgery is not required, you will have time to prepare for the procedure. Laparoscopic surgery is an alternative to open surgery. Laparoscopic surgery has a shorter recovery time. Your common bile duct may also need to be examined during the procedure. If stones are found in the common bile duct, they may be removed. LET [facility] CARE PROVIDER KNOW ABOUT:  Any allergies you have.  All  medicines you are taking, including vitamins, herbs, eye drops, creams, and over-the-counter medicines.  Previous problems you or members of your family have had with the use of anesthetics.  Any blood disorders you have.  Previous surgeries you have had.    Any medical conditions you have. RISKS AND COMPLICATIONS Generally, this is a safe procedure. However, problems may occur, including:  Infection.  Bleeding.  Allergic reactions to medicines.  Damage to other structures or organs.  A stone remaining in the common bile duct.  A bile leak from the cyst duct that is clipped when your gallbladder is removed.  The need to convert to open surgery, which requires a larger incision in the abdomen. This may be necessary if your surgeon thinks that it is not safe to continue with a laparoscopic procedure. BEFORE THE PROCEDURE  Ask your health care provider about:  Changing or stopping your regular medicines. This is especially important if you are taking diabetes medicines or blood thinners.  Taking medicines such as aspirin and ibuprofen. These medicines can thin your blood. Do not take these medicines before your procedure if your health care provider instructs you not to.  Follow instructions from your health care provider about eating or drinking restrictions.  Let your health care provider know if you develop a cold or an infection before surgery.  Plan to have someone take you home after the procedure.  Ask your health care provider how your surgical site will be marked or identified.  You may be given antibiotic medicine to help prevent  infection. PROCEDURE  To reduce your risk of infection:  Your health care team will wash or sanitize their hands.  Your skin will be washed with soap.  An IV tube may be inserted into one of your veins.  You will be given a medicine to make you fall asleep (general anesthetic).  A breathing tube will be placed in your  mouth.  The surgeon will make several small cuts (incisions) in your abdomen.  A thin, lighted tube (laparoscope) that has a tiny camera on the end will be inserted through one of the small incisions. The camera on the laparoscope will send a picture to a TV screen (monitor) in the operating room. This will give the surgeon a good view inside your abdomen.  A gas will be pumped into your abdomen. This will expand your abdomen to give the surgeon more room to perform the surgery.  Other tools that are needed for the procedure will be inserted through the other incisions. The gallbladder will be removed through one of the incisions.  After your gallbladder has been removed, the incisions will be closed with stitches (sutures), staples, or skin glue.  Your incisions may be covered with a bandage (dressing). The procedure may vary among health care providers and hospitals. AFTER THE PROCEDURE  Your blood pressure, heart rate, breathing rate, and blood oxygen level will be monitored often until the medicines you were given have worn off.  You will be given medicines as needed to control your pain.   This information is not intended to replace advice given to you by your health care provider. Make sure you discuss any questions you have with your health care provider.   Document Released: 01/20/2005 Document Revised: 10/11/2014 Document Reviewed: 09/01/2012 Elsevier Interactive Patient Education 2016 Elsevier Inc.   Low-Fat Diet for Gallbladder Conditions A low-fat diet can be helpful if you have pancreatitis or a gallbladder condition. With these conditions, your pancreas and gallbladder have trouble digesting fats. A healthy eating plan with less fat will help rest your pancreas and gallbladder and reduce your symptoms. WHAT DO I NEED TO KNOW ABOUT THIS DIET?  Eat a low-fat diet.  Reduce your fat intake to less than 20-30% of your total daily calories. This is less than 50-60 g of fat  per day.  Remember that you need some fat in your diet. Ask your dietician what your daily goal should be.  Choose nonfat and low-fat healthy foods. Look for the words "nonfat," "low fat," or "fat free."  As a guide, look on the label and choose foods with less than 3 g of fat per serving. Eat only one serving.  Avoid alcohol.  Do not smoke. If you need help quitting, talk with your health care provider.  Eat small frequent meals instead of three large heavy meals. WHAT FOODS CAN I EAT? Grains Include healthy grains and starches such as potatoes, wheat bread, fiber-rich cereal, and brown rice. Choose whole grain options whenever possible. In adults, whole grains should account for 45-65% of your daily calories.  Fruits and Vegetables Eat plenty of fruits and vegetables. Fresh fruits and vegetables add fiber to your diet. Meats and Other Protein Sources Eat lean meat such as chicken and pork. Trim any fat off of meat before cooking it. Eggs, fish, and beans are other sources of protein. In adults, these foods should account for 10-35% of your daily calories. Dairy Choose low-fat milk and dairy options. Dairy includes fat and protein, as well  as calcium.  Fats and Oils Limit high-fat foods such as fried foods, sweets, baked goods, sugary drinks.  Other Creamy sauces and condiments, such as mayonnaise, can add extra fat. Think about whether or not you need to use them, or use smaller amounts or low fat options. WHAT FOODS ARE NOT RECOMMENDED?  High fat foods, such as:  [employer].  Ice cream.  [place] toast.  Sweet rolls.  Pizza.  Cheese bread.  Foods covered with batter, butter, creamy sauces, or cheese.  Fried foods.  Sugary drinks and desserts.  Foods that cause gas or bloating   This information is not intended to replace advice given to you by your health care provider. Make sure you discuss any questions you have with your health care provider.   Document  Released: 01/25/2013 Document Reviewed: 01/25/2013 Elsevier Interactive Patient Education [employer]

## 2020-02-16 NOTE — Telephone Encounter (Signed)
Patient has been advised of Pre-Admission date/time, COVID Testing date and Surgery date.  Surgery Date: 03/09/20 Preadmission Testing Date: 03/02/20 (phone 8a-1p) Covid Testing Date: 03/07/20 - patient advised to [facility] ([address] [facility]) between 8a-1p  Patient has been made aware to call [phone], between 1-3:00pm the day before surgery, to find out what time to arrive for surgery.

## 2020-02-16 NOTE — H&P (View-Only) (Signed)
Patient ID: Dean Dominguez, male   DOB: 01/08/1961, 60 y.o.   MRN: [id number]  Chief Complaint  Patient presents with  . Follow-up    HPI Dean Dominguez is a 59 y.o. male.   He is here today for follow-up of cholecystitis.  He initially presented to my clinic on December 16 after having been seen in the emergency department for abdominal pain.  He was exquisitely tender, with peritoneal signs and therefore was sent to the hospital.  Due to the appearance of his gallbladder on CT and ultrasound (it appeared gangrenous), a percutaneous cholecystostomy tube was placed.  He had issues with leaking and the drain had to be replaced 2 days later.  He was scheduled for a nurse visit last week, but was seen by my partner, Dr. Pabon, who ordered a CT scan of the abdomen and pelvis to check for any abscess and that the tube was in good position.  The CT scan was performed and no abscess was seen; the cholecystostomy tube was in good position.  Mr. [name] is here today to discuss plans for cholecystectomy.  He denies any fevers or chills.  No nausea or vomiting.  He continues to flush the drain daily, but states that there is not a whole lot of output.  He denies any jaundice.  His appetite is good, but he has been restricting himself to soup and bland foods.  He is eager to advance his diet.  He is only sore at the skin exit site of his drain.   Past Medical History:  Diagnosis Date  . Arthritis    hands & knees  . Depression   . Dyspnea    DOE (pt denies 02/16/18)  . Hypertension   . Tobacco dependence     Past Surgical History:  Procedure Laterality Date  . ANTERIOR CRUCIATE LIGAMENT REPAIR Right 1999  . CATARACT EXTRACTION W/PHACO Left 04/29/2017   Procedure: CATARACT EXTRACTION PHACO AND INTRAOCULAR LENS PLACEMENT (IOC);  Surgeon: Porfilio, William, MD;  Location: [facility] ORS;  Service: Ophthalmology;  Laterality: Left;  US 00:34 AP% 17.6 CDE 6.09 Fluid pak lot # [id number]  . CATARACT  EXTRACTION W/PHACO Right 05/19/2017   Procedure: CATARACT EXTRACTION PHACO AND INTRAOCULAR LENS PLACEMENT (IOC);  Surgeon: Porfilio, William, MD;  Location: [facility] ORS;  Service: Ophthalmology;  Laterality: Right;  US 00:30 AP% 14.6 CDE 4.51 Fluid pack lot # [id number]  . IR EXCHANGE BILIARY DRAIN  01/26/2020  . METAL PLATES  left foot Left 2018  . SHOULDER ARTHROSCOPY Left 02/18/2018   Procedure: ARTHROSCOPY SHOULDER WITH EXTENSIVE DEBRIDEMENT, ROTATOR CUFF REPAIR AND SUBACROMIAL DECOMPRESSION;  Surgeon: Patel, Sunny, MD;  Location: [facility];  Service: Orthopedics;  Laterality: Left;  ARTHREX SWIVELOCK 4.75 MM  OTHER ARTHROCARE WAND BARROL BURR OPEN SHID KOLBALL HOMANS COBBS  . TENDON REPAIR Left 01/09/2017   Procedure: TENDON REPAIR-TIBIALIS TENDON;  Surgeon: Fowler, Justin, DPM;  Location: [facility] OR[facility];  Service: Podiatry;  Laterality: Left;  . TONSILLECTOMY    . WISDOM TOOTH EXTRACTION  1978   2 wisdom teeth extracted    Family History  Problem Relation Age of Onset  . Hypertension Mother   . Healthy Father     Social History Social History   Tobacco Use  . Smoking status: Current Every Day Smoker    Packs/day: 1.00    Years: 40.00    Pack years: 40.00    Types: Cigarettes  . Smokeless tobacco: Former User    Quit   date: 02/03/1997  Vaping Use  . Vaping Use: Never used  Substance Use Topics  . Alcohol use: Yes    Alcohol/week: 6.0 standard drinks    Types: 6 Cans of beer per week  . Drug use: Yes    Frequency: 2.0 times per week    Types: Marijuana    Comment: used 10 days ago    No Known Allergies  Current Outpatient Medications  Medication Sig Dispense Refill  . acetaminophen (TYLENOL) 650 MG CR tablet Take by mouth.    . fluticasone (FLONASE) 50 MCG/ACT nasal spray Place 1 spray into both nostrils daily.    Marland Kitchen losartan (COZAAR) 25 MG tablet Take 25 mg by mouth daily.    . Multiple Vitamins-Minerals (MULTIVITAMIN PO) Take 1 tablet by mouth daily.    . naproxen  (NAPROSYN) 500 MG tablet Take 500 mg by mouth 2 (two) times daily.    . ondansetron (ZOFRAN ODT) 4 MG disintegrating tablet Allow 1-2 tablets to dissolve in your mouth every 8 hours as needed for nausea/vomiting 30 tablet 0  . oxyCODONE (OXY IR/ROXICODONE) 5 MG immediate release tablet Take 1 tablet (5 mg total) by mouth every 6 (six) hours as needed for severe pain or breakthrough pain. 15 tablet 0  . sodium chloride flush (NS) 0.9 % SOLN 5 mLs by Intracatheter route daily. 30 Syringe 1  . sulfamethoxazole-trimethoprim (BACTRIM) 400-80 MG tablet Take 1 tablet by mouth 2 (two) times daily for 10 days. 20 tablet 0  . venlafaxine XR (EFFEXOR-XR) 75 MG 24 hr capsule Take 75 mg by mouth at bedtime.  0   No current facility-administered medications for this visit.    Review of Systems Review of Systems  All other systems reviewed and are negative.   Blood pressure (!) 147/82, pulse 69, temperature 98.3 F (36.8 C), height 5\' 10"  (1.778 m), weight 166 lb (75.3 kg), SpO2 97 %. Body mass index is 23.82 kg/m.  Physical Exam Physical Exam Constitutional:      General: He is not in acute distress.    Appearance: Normal appearance. He is normal weight.  HENT:     Head: Normocephalic and atraumatic.     Nose:     Comments: Covered with a mask    Mouth/Throat:     Comments: Covered with a mask Eyes:     General: No scleral icterus.       Right eye: No discharge.        Left eye: No discharge.     Conjunctiva/sclera: Conjunctivae normal.  Cardiovascular:     Rate and Rhythm: Normal rate and regular rhythm.  Pulmonary:     Effort: Pulmonary effort is normal. No respiratory distress.  Abdominal:     Palpations: Abdomen is soft.     Comments: Right upper quadrant drain with purulent bile-tinged drainage in the bag.  Genitourinary:    Comments: Deferred Musculoskeletal:        General: No deformity or signs of injury.  Skin:    General: Skin is warm and dry.     Coloration: Skin is not  jaundiced.  Neurological:     General: No focal deficit present.     Mental Status: He is alert and oriented to person, place, and time.  Psychiatric:        Mood and Affect: Mood normal.        Behavior: Behavior normal.     Data Reviewed Results for RALIEGH, SCOBIE (MRN Toni Amend) as of 02/16/2020  11:20  Ref. Range 02/13/2020 14:56  Sodium Latest Ref Range: 135 - 145 mmol/L 134 (L)  Potassium Latest Ref Range: 3.5 - 5.1 mmol/L 3.8  Chloride Latest Ref Range: 98 - 111 mmol/L 96 (L)  CO2 Latest Ref Range: 22 - 32 mmol/L 26  Glucose Latest Ref Range: 70 - 99 mg/dL 94  BUN Latest Ref Range: 8 - 23 mg/dL 18  Creatinine Latest Ref Range: 0.61 - 1.24 mg/dL 4.23  Calcium Latest Ref Range: 8.9 - 10.3 mg/dL 8.6 (L)  Anion gap Latest Ref Range: 5 - 15  12  Alkaline Phosphatase Latest Ref Range: 38 - 126 U/L 72  Albumin Latest Ref Range: 3.5 - 5.0 g/dL 3.2 (L)  AST Latest Ref Range: 15 - 41 U/L 12 (L)  ALT Latest Ref Range: 0 - 44 U/L 8  Total Protein Latest Ref Range: 6.5 - 8.1 g/dL 6.7  Total Bilirubin Latest Ref Range: 0.3 - 1.2 mg/dL 0.6  GFR, Estimated Latest Ref Range: >60 mL/min >60  WBC Latest Ref Range: 4.0 - 10.5 K/uL 13.1 (H)  RBC Latest Ref Range: 4.22 - 5.81 MIL/uL 3.64 (L)  Hemoglobin Latest Ref Range: 13.0 - 17.0 g/dL 53.6 (L)  HCT Latest Ref Range: 39.0 - 52.0 % 36.5 (L)  MCV Latest Ref Range: 80.0 - 100.0 fL 100.3 (H)  MCH Latest Ref Range: 26.0 - 34.0 pg 33.8  MCHC Latest Ref Range: 30.0 - 36.0 g/dL 14.4  RDW Latest Ref Range: 11.5 - 15.5 % 12.8  Platelets Latest Ref Range: 150 - 400 K/uL 364  nRBC Latest Ref Range: 0.0 - 0.2 % 0.0  Neutrophils Latest Units: % 79  Lymphocytes Latest Units: % 9  Monocytes Relative Latest Units: % 8  Eosinophil Latest Units: % 3  Basophil Latest Units: % 1  Immature Granulocytes Latest Units: % 0  NEUT# Latest Ref Range: 1.7 - 7.7 K/uL 10.3 (H)  Lymphocyte # Latest Ref Range: 0.7 - 4.0 K/uL 1.1  Monocyte # Latest Ref Range:  0.1 - 1.0 K/uL 1.1 (H)  Eosinophils Absolute Latest Ref Range: 0.0 - 0.5 K/uL 0.4  Basophils Absolute Latest Ref Range: 0.0 - 0.1 K/uL 0.1  Abs Immature Granulocytes Latest Ref Range: 0.00 - 0.07 K/uL 0.05  These labs were done last week after his visit.  They do show a leukocytosis and some mild hyponatremia and hypochloremia.  I also reviewed the CT scan done last week.  I concur with the radiology interpretation copied here: CLINICAL DATA:  Tenderness and drainage at percutaneous cholecystostomy site  EXAM: CT ABDOMEN AND PELVIS WITH CONTRAST  TECHNIQUE: Multidetector CT imaging of the abdomen and pelvis was performed using the standard protocol following bolus administration of intravenous contrast.  CONTRAST:  OMNIPAQUE IOHEXOL 300 MG/ML  SOLN  COMPARISON:  01/19/2020  FINDINGS: Lower chest: Included lung bases are clear.  Heart size is normal.  Hepatobiliary: 1.1 cm indeterminate lesion within the left hepatic lobe (series 2, image 14), which demonstrated sonographic appearance most suggestive of a hemangioma. No additional liver lesions. There is a percutaneous cholecystostomy tube in place with pigtail catheter appropriately position within the gallbladder fundus. Gallbladder remains mildly distended containing multiple gallstones. There is persistent gallbladder wall thickening and mild pericholecystic fat stranding, which has slightly improved from prior. No adjacent fluid collections. No biliary dilatation.  Pancreas: Unremarkable. No pancreatic ductal dilatation or surrounding inflammatory changes.  Spleen: Stable in size and appearance.  Adrenals/Urinary Tract: Unremarkable adrenal glands. Tiny nonobstructing right renal stone.  Kidneys otherwise unremarkable with symmetric enhancement. No hydronephrosis. Borderline wall thickening of the urinary bladder.  Stomach/Bowel: Stomach is within normal limits. Appendix appears normal. Small bowel is  well distended with oral contrast. Colonic thickening at the hepatic flexure has improved. No evidence of focal bowel wall thickening, distention, or inflammatory changes.  Vascular/Lymphatic: Scattered aortoiliac atherosclerotic calcifications without aneurysm. No abdominopelvic lymphadenopathy.  Reproductive: Nonenlarged prostate gland.  Other: Mild induration within the subcutaneous tissues at the catheter insertion site without fluid collection. No ascites. No abdominopelvic fluid collection or abscess. No pneumoperitoneum.  Musculoskeletal: No acute or significant osseous findings.  IMPRESSION: 1. Percutaneous cholecystostomy tube in place with pigtail catheter appropriately positioned within the gallbladder fundus. Persistent gallbladder wall thickening and mild pericholecystic fat stranding, slightly improved from prior study. No adjacent fluid collections. 2. Colonic thickening at the hepatic flexure has improved. 3. Borderline wall thickening of the urinary bladder. Correlate with urinalysis to exclude cystitis. 4. Tiny nonobstructing right renal stone. 5. Single 1.1 cm indeterminate lesion within the left hepatic lobe, which demonstrated sonographic appearance most suggestive of a hemangioma. 6. Aortic atherosclerosis (ICD10-I70.0).  Assessment This is a 60 year old man who presented with severe acute cholecystitis 3 weeks ago.  A biliary drain is in place and continues to drain somewhat purulent fluid, which likely accounts for the leukocytosis seen on his labs last week.  He was started on Bactrim in response to this.  He is interested in undergoing cholecystectomy.  Plan I would like the drain to remain in place for another 3 weeks or so.  After that, we will plan to proceed with a robot-assisted laparoscopic cholecystectomy.  I discussed the risks of cholecystectomy with Mr. Stern.  These include, but are not limited to, bleeding, infection, damage to surrounding  tissues or structures, need for ERCP or other intervention should there be retained stones/sludge, or biliary leak.  There is also the potential need to convert to an open procedure if it becomes unsafe to proceed via a laparoscopic approach.  We will work on getting him scheduled.  I also assured him that he could eat foods that he chooses, but that if he experiences pain, he should stop and go back to a more bland diet.    [name] 02/16/2020, 11:13 AM

## 2020-02-16 NOTE — Progress Notes (Signed)
Patient ID: Dean Dominguez, male   DOB: 04/23/1960, 60 y.o.   MRN: [id number]  Chief Complaint  Patient presents with  . Follow-up    HPI Dean Dominguez is a 60 y.o. male.   He is here today for follow-up of cholecystitis.  He initially presented to my clinic on December 16 after having been seen in the emergency department for abdominal pain.  He was exquisitely tender, with peritoneal signs and therefore was sent to the hospital.  Due to the appearance of his gallbladder on CT and ultrasound (it appeared gangrenous), a percutaneous cholecystostomy tube was placed.  He had issues with leaking and the drain had to be replaced 2 days later.  He was scheduled for a nurse visit last week, but was seen by my partner, Dr. Everlene FarrierPabon, who ordered a CT scan of the abdomen and pelvis to check for any abscess and that the tube was in good position.  The CT scan was performed and no abscess was seen; the cholecystostomy tube was in good position.  Mr. Dean Dominguez is here today to discuss plans for cholecystectomy.  He denies any fevers or chills.  No nausea or vomiting.  He continues to flush the drain daily, but states that there is not a whole lot of output.  He denies any jaundice.  His appetite is good, but he has been restricting himself to soup and bland foods.  He is eager to advance his diet.  He is only sore at the skin exit site of his drain.   Past Medical History:  Diagnosis Date  . Arthritis    hands & knees  . Depression   . Dyspnea    DOE (pt denies 02/16/18)  . Hypertension   . Tobacco dependence     Past Surgical History:  Procedure Laterality Date  . ANTERIOR CRUCIATE LIGAMENT REPAIR Right 1999  . CATARACT EXTRACTION W/PHACO Left 04/29/2017   Procedure: CATARACT EXTRACTION PHACO AND INTRAOCULAR LENS PLACEMENT (IOC);  Surgeon: Dean ManilaPorfilio, William, MD;  Location: [facility];  Service: Ophthalmology;  Laterality: Left;  US 00:34 AP% 17.6 CDE 6.09 Fluid pak lot # [id number]  . CATARACT  EXTRACTION W/PHACO Right 05/19/2017   Procedure: CATARACT EXTRACTION PHACO AND INTRAOCULAR LENS PLACEMENT (IOC);  Surgeon: Dean ManilaPorfilio, William, MD;  Location: [facility];  Service: Ophthalmology;  Laterality: Right;  US 00:30 AP% 14.6 CDE 4.51 Fluid pack lot # [id number]  . IR EXCHANGE BILIARY DRAIN  01/26/2020  . METAL PLATES  left foot Left 2018  . SHOULDER ARTHROSCOPY Left 02/18/2018   Procedure: ARTHROSCOPY SHOULDER WITH EXTENSIVE DEBRIDEMENT, ROTATOR CUFF REPAIR AND SUBACROMIAL DECOMPRESSION;  Surgeon: Dean KellPatel, Sunny, MD;  [facility];  Service: Orthopedics;  Laterality: Left;  ARTHREX SWIVELOCK 4.75 MM  OTHER ARTHROCARE WAND BARROL BURR OPEN SHID [employer] COBBS  . TENDON REPAIR Left 01/09/2017   Procedure: TENDON REPAIR-TIBIALIS TENDON;  Surgeon: Dean Dominguez, DPM;  Location: [facility];  Service: Podiatry;  Laterality: Left;  . TONSILLECTOMY    . WISDOM TOOTH EXTRACTION  1978   2 wisdom teeth extracted    Family History  Problem Relation Age of Onset  . Hypertension Mother   . Healthy Father     Social History Social History   Tobacco Use  . Smoking status: Current Every Day Smoker    Packs/day: 1.00    Years: 40.00    Pack years: 40.00    Types: Cigarettes  . Smokeless tobacco: Former Careers information officerUser    Quit  date: 02/03/1997  Vaping Use  . Vaping Use: Never used  Substance Use Topics  . Alcohol use: Yes    Alcohol/week: 6.0 standard drinks    Types: 6 Cans of beer per week  . Drug use: Yes    Frequency: 2.0 times per week    Types: Marijuana    Comment: used 10 days ago    No Known Allergies  Current Outpatient Medications  Medication Sig Dispense Refill  . acetaminophen (TYLENOL) 650 MG CR tablet Take by mouth.    . fluticasone (FLONASE) 50 MCG/ACT nasal spray Place 1 spray into both nostrils daily.    Marland Kitchen losartan (COZAAR) 25 MG tablet Take 25 mg by mouth daily.    . Multiple Vitamins-Minerals (MULTIVITAMIN PO) Take 1 tablet by mouth daily.    . naproxen  (NAPROSYN) 500 MG tablet Take 500 mg by mouth 2 (two) times daily.    . ondansetron (ZOFRAN ODT) 4 MG disintegrating tablet Allow 1-2 tablets to dissolve in your mouth every 8 hours as needed for nausea/vomiting 30 tablet 0  . oxyCODONE (OXY IR/ROXICODONE) 5 MG immediate release tablet Take 1 tablet (5 mg total) by mouth every 6 (six) hours as needed for severe pain or breakthrough pain. 15 tablet 0  . sodium chloride flush (NS) 0.9 % SOLN 5 mLs by Intracatheter route daily. 30 Syringe 1  . sulfamethoxazole-trimethoprim (BACTRIM) 400-80 MG tablet Take 1 tablet by mouth 2 (two) times daily for 10 days. 20 tablet 0  . venlafaxine XR (EFFEXOR-XR) 75 MG 24 hr capsule Take 75 mg by mouth at bedtime.  0   No current facility-administered medications for this visit.    Review of Systems Review of Systems  All other systems reviewed and are negative.   Blood pressure (!) 147/82, pulse 69, temperature 98.3 F (36.8 C), height 5\' 10"  (1.778 m), weight 166 lb (75.3 kg), SpO2 97 %. Body mass index is 23.82 kg/m.  Physical Exam Physical Exam Constitutional:      General: He is not in acute distress.    Appearance: Normal appearance. He is normal weight.  HENT:     Head: Normocephalic and atraumatic.     Nose:     Comments: Covered with a mask    Mouth/Throat:     Comments: Covered with a mask Eyes:     General: No scleral icterus.       Right eye: No discharge.        Left eye: No discharge.     Conjunctiva/sclera: Conjunctivae normal.  Cardiovascular:     Rate and Rhythm: Normal rate and regular rhythm.  Pulmonary:     Effort: Pulmonary effort is normal. No respiratory distress.  Abdominal:     Palpations: Abdomen is soft.     Comments: Right upper quadrant drain with purulent bile-tinged drainage in the bag.  Genitourinary:    Comments: Deferred Musculoskeletal:        General: No deformity or signs of injury.  Skin:    General: Skin is warm and dry.     Coloration: Skin is not  jaundiced.  Neurological:     General: No focal deficit present.     Mental Status: He is alert and oriented to person, place, and time.  Psychiatric:        Mood and Affect: Mood normal.        Behavior: Behavior normal.     Data Reviewed Results for RALIEGH, SCOBIE (MRN Toni Amend) as of 02/16/2020  11:20  Ref. Range 02/13/2020 14:56  Sodium Latest Ref Range: 135 - 145 mmol/L 134 (L)  Potassium Latest Ref Range: 3.5 - 5.1 mmol/L 3.8  Chloride Latest Ref Range: 98 - 111 mmol/L 96 (L)  CO2 Latest Ref Range: 22 - 32 mmol/L 26  Glucose Latest Ref Range: 70 - 99 mg/dL 94  BUN Latest Ref Range: 8 - 23 mg/dL 18  Creatinine Latest Ref Range: 0.61 - 1.24 mg/dL 4.23  Calcium Latest Ref Range: 8.9 - 10.3 mg/dL 8.6 (L)  Anion gap Latest Ref Range: 5 - 15  12  Alkaline Phosphatase Latest Ref Range: 38 - 126 U/L 72  Albumin Latest Ref Range: 3.5 - 5.0 g/dL 3.2 (L)  AST Latest Ref Range: 15 - 41 U/L 12 (L)  ALT Latest Ref Range: 0 - 44 U/L 8  Total Protein Latest Ref Range: 6.5 - 8.1 g/dL 6.7  Total Bilirubin Latest Ref Range: 0.3 - 1.2 mg/dL 0.6  GFR, Estimated Latest Ref Range: >60 mL/min >60  WBC Latest Ref Range: 4.0 - 10.5 K/uL 13.1 (H)  RBC Latest Ref Range: 4.22 - 5.81 MIL/uL 3.64 (L)  Hemoglobin Latest Ref Range: 13.0 - 17.0 g/dL 53.6 (L)  HCT Latest Ref Range: 39.0 - 52.0 % 36.5 (L)  MCV Latest Ref Range: 80.0 - 100.0 fL 100.3 (H)  MCH Latest Ref Range: 26.0 - 34.0 pg 33.8  MCHC Latest Ref Range: 30.0 - 36.0 g/dL 14.4  RDW Latest Ref Range: 11.5 - 15.5 % 12.8  Platelets Latest Ref Range: 150 - 400 K/uL 364  nRBC Latest Ref Range: 0.0 - 0.2 % 0.0  Neutrophils Latest Units: % 79  Lymphocytes Latest Units: % 9  Monocytes Relative Latest Units: % 8  Eosinophil Latest Units: % 3  Basophil Latest Units: % 1  Immature Granulocytes Latest Units: % 0  NEUT# Latest Ref Range: 1.7 - 7.7 K/uL 10.3 (H)  Lymphocyte # Latest Ref Range: 0.7 - 4.0 K/uL 1.1  Monocyte # Latest Ref Range:  0.1 - 1.0 K/uL 1.1 (H)  Eosinophils Absolute Latest Ref Range: 0.0 - 0.5 K/uL 0.4  Basophils Absolute Latest Ref Range: 0.0 - 0.1 K/uL 0.1  Abs Immature Granulocytes Latest Ref Range: 0.00 - 0.07 K/uL 0.05  These labs were done last week after his visit.  They do show a leukocytosis and some mild hyponatremia and hypochloremia.  I also reviewed the CT scan done last week.  I concur with the radiology interpretation copied here: CLINICAL DATA:  Tenderness and drainage at percutaneous cholecystostomy site  EXAM: CT ABDOMEN AND PELVIS WITH CONTRAST  TECHNIQUE: Multidetector CT imaging of the abdomen and pelvis was performed using the standard protocol following bolus administration of intravenous contrast.  CONTRAST:  OMNIPAQUE IOHEXOL 300 MG/ML  SOLN  COMPARISON:  01/19/2020  FINDINGS: Lower chest: Included lung bases are clear.  Heart size is normal.  Hepatobiliary: 1.1 cm indeterminate lesion within the left hepatic lobe (series 2, image 14), which demonstrated sonographic appearance most suggestive of a hemangioma. No additional liver lesions. There is a percutaneous cholecystostomy tube in place with pigtail catheter appropriately position within the gallbladder fundus. Gallbladder remains mildly distended containing multiple gallstones. There is persistent gallbladder wall thickening and mild pericholecystic fat stranding, which has slightly improved from prior. No adjacent fluid collections. No biliary dilatation.  Pancreas: Unremarkable. No pancreatic ductal dilatation or surrounding inflammatory changes.  Spleen: Stable in size and appearance.  Adrenals/Urinary Tract: Unremarkable adrenal glands. Tiny nonobstructing right renal stone.  Kidneys otherwise unremarkable with symmetric enhancement. No hydronephrosis. Borderline wall thickening of the urinary bladder.  Stomach/Bowel: Stomach is within normal limits. Appendix appears normal. Small bowel is  well distended with oral contrast. Colonic thickening at the hepatic flexure has improved. No evidence of focal bowel wall thickening, distention, or inflammatory changes.  Vascular/Lymphatic: Scattered aortoiliac atherosclerotic calcifications without aneurysm. No abdominopelvic lymphadenopathy.  Reproductive: Nonenlarged prostate gland.  Other: Mild induration within the subcutaneous tissues at the catheter insertion site without fluid collection. No ascites. No abdominopelvic fluid collection or abscess. No pneumoperitoneum.  Musculoskeletal: No acute or significant osseous findings.  IMPRESSION: 1. Percutaneous cholecystostomy tube in place with pigtail catheter appropriately positioned within the gallbladder fundus. Persistent gallbladder wall thickening and mild pericholecystic fat stranding, slightly improved from prior study. No adjacent fluid collections. 2. Colonic thickening at the hepatic flexure has improved. 3. Borderline wall thickening of the urinary bladder. Correlate with urinalysis to exclude cystitis. 4. Tiny nonobstructing right renal stone. 5. Single 1.1 cm indeterminate lesion within the left hepatic lobe, which demonstrated sonographic appearance most suggestive of a hemangioma. 6. Aortic atherosclerosis (ICD10-I70.0).  Assessment This is a 60 year old man who presented with severe acute cholecystitis 3 weeks ago.  A biliary drain is in place and continues to drain somewhat purulent fluid, which likely accounts for the leukocytosis seen on his labs last week.  He was started on Bactrim in response to this.  He is interested in undergoing cholecystectomy.  Plan I would like the drain to remain in place for another 3 weeks or so.  After that, we will plan to proceed with a robot-assisted laparoscopic cholecystectomy.  I discussed the risks of cholecystectomy with Mr. Stern.  These include, but are not limited to, bleeding, infection, damage to surrounding  tissues or structures, need for ERCP or other intervention should there be retained stones/sludge, or biliary leak.  There is also the potential need to convert to an open procedure if it becomes unsafe to proceed via a laparoscopic approach.  We will work on getting him scheduled.  I also assured him that he could eat foods that he chooses, but that if he experiences pain, he should stop and go back to a more bland diet.    [name] 02/16/2020, 11:13 AM

## 2020-02-23 ENCOUNTER — Ambulatory Visit: Payer: 59 | Admitting: General Surgery

## 2020-03-02 ENCOUNTER — Encounter
Admission: RE | Admit: 2020-03-02 | Discharge: 2020-03-02 | Disposition: A | Discharge status: Home / Self Care | Payer: 59 | Source: Ambulatory Visit | Attending: General Surgery | Admitting: General Surgery

## 2020-03-02 ENCOUNTER — Other Ambulatory Visit: Payer: Self-pay

## 2020-03-02 HISTORY — DX: Personal history of Methicillin resistant Staphylococcus aureus infection: Z86.14

## 2020-03-02 NOTE — Patient Instructions (Signed)
Your procedure is scheduled on: 03/09/20 Report to [facility]. To find out your arrival time please call [phone] between 1PM - 3PM on 03/08/20.  Remember: Instructions that are not followed completely may result in serious medical risk, up to and including death, or upon the discretion of your surgeon and anesthesiologist your surgery may need to be rescheduled.     _X__ 1. Do not eat food after midnight the night before your procedure.                 No gum chewing or hard candies. You may drink clear liquids up to 2 hours                 before you are scheduled to arrive for your surgery- DO not drink clear                 liquids within 2 hours of the start of your surgery.                 Clear Liquids include:  water, apple juice without pulp, clear carbohydrate                 drink such as Clearfast or Gatorade, Black Coffee or Tea (Do not add                 anything to coffee or tea). Diabetics water only  __X__2.  On the morning of surgery brush your teeth with toothpaste and water, you                 may rinse your mouth with mouthwash if you wish.  Do not swallow any              toothpaste of mouthwash.     _X__ 3.  No Alcohol for 24 hours before or after surgery.   _X__ 4.  Do Not Smoke or use e-cigarettes For 24 Hours Prior to Your Surgery.                 Do not use any chewable tobacco products for at least 6 hours prior to                 surgery.  ____  5.  Bring all medications with you on the day of surgery if instructed.   __X__  6.  Notify your doctor if there is any change in your medical condition      (cold, fever, infections).     Do not wear jewelry, make-up, hairpins, clips or nail polish. Do not wear lotions, powders, or perfumes.  Do not shave 48 hours prior to surgery. Men may shave face and neck. Do not bring valuables to the hospital.    [facility] is not responsible for any belongings or  valuables.  Contacts, dentures/partials or body piercings may not be worn into surgery. Bring a case for your contacts, glasses or hearing aids, a denture cup will be supplied. Leave your suitcase in the car. After surgery it may be brought to your room. For patients admitted to the hospital, discharge time is determined by your treatment team.   Patients discharged the day of surgery will not be allowed to drive home.   Please read over the following fact sheets that you were given:   MRSA Information, CHG soap  __X__ Take these medicines the morning of surgery with A SIP OF WATER:  1. May use fluticasone (FLONASE) 50 MCG/ACT nasal spray  2.   3.   4.  5.  6.  ____ Fleet Enema (as directed)   __X__ Use CHG Soap/SAGE wipes as directed  ____ Use inhalers on the day of surgery  ____ Stop metformin/Janumet/Farxiga 2 days prior to surgery    ____ Take 1/2 of usual insulin dose the night before surgery. No insulin the morning          of surgery.   ____ Stop Blood Thinners Coumadin/Plavix/Xarelto/Pleta/Pradaxa/Eliquis/Effient/Aspirin  on   Or contact your Surgeon, Cardiologist or Medical Doctor regarding  ability to stop your blood thinners  __X__ Stop Anti-inflammatories 7 days before surgery such as Advil, Ibuprofen, Motrin,  BC or Goodies Powder, Naprosyn, Naproxen, Aleve, Aspirin    __X__ Stop all herbal supplements, fish oil or vitamin E until after surgery.    ____ Bring C-Pap to the hospital.

## 2020-03-07 ENCOUNTER — Other Ambulatory Visit: Admission: RE | Admit: 2020-03-07 | Payer: 59 | Source: Ambulatory Visit

## 2020-03-07 ENCOUNTER — Other Ambulatory Visit: Payer: Self-pay

## 2020-03-07 ENCOUNTER — Other Ambulatory Visit
Admission: RE | Admit: 2020-03-07 | Discharge: 2020-03-07 | Disposition: A | Discharge status: Home / Self Care | Payer: 59 | Source: Ambulatory Visit | Attending: General Surgery | Admitting: General Surgery

## 2020-03-07 DIAGNOSIS — Z01812 Encounter for preprocedural laboratory examination: Secondary | ICD-10-CM | POA: Insufficient documentation

## 2020-03-07 DIAGNOSIS — Z20822 Contact with and (suspected) exposure to covid-19: Secondary | ICD-10-CM | POA: Insufficient documentation

## 2020-03-07 LAB — SARS CORONAVIRUS 2 (TAT 6-24 HRS): SARS Coronavirus 2: NEGATIVE

## 2020-03-08 MED ORDER — ORAL CARE MOUTH RINSE: 15.0000 mL | Freq: Once | OROMUCOSAL | Status: AC

## 2020-03-08 MED ORDER — INDOCYANINE GREEN 25 MG IV SOLR
7.5000 mg | Freq: Once | INTRAVENOUS | Status: AC
  Administered 2020-03-09: 7.5 mg via INTRAVENOUS
  Filled 2020-03-08: qty 3

## 2020-03-08 MED ORDER — SODIUM CHLORIDE 0.9 % IV SOLN
2.0000 g | INTRAVENOUS | Status: AC
  Administered 2020-03-09: 2 g via INTRAVENOUS

## 2020-03-08 MED ORDER — CHLORHEXIDINE GLUCONATE CLOTH 2 % EX PADS
6.0000 | MEDICATED_PAD | Freq: Once | CUTANEOUS | Status: AC
  Administered 2020-03-09: 6 via TOPICAL

## 2020-03-08 MED ORDER — GABAPENTIN 300 MG PO CAPS: 300.0000 mg | ORAL_CAPSULE | ORAL | Status: AC

## 2020-03-08 MED ORDER — CELECOXIB 200 MG PO CAPS: 200.0000 mg | ORAL_CAPSULE | ORAL | Status: AC

## 2020-03-08 MED ORDER — CHLORHEXIDINE GLUCONATE 0.12 % MT SOLN: 15.0000 mL | Freq: Once | OROMUCOSAL | Status: AC

## 2020-03-08 MED ORDER — ACETAMINOPHEN 500 MG PO TABS: 1000.0000 mg | ORAL_TABLET | ORAL | Status: AC

## 2020-03-08 MED ORDER — FAMOTIDINE 20 MG PO TABS: 20.0000 mg | ORAL_TABLET | Freq: Once | ORAL | Status: AC

## 2020-03-08 MED ORDER — LACTATED RINGERS IV SOLN: INTRAVENOUS | Status: DC

## 2020-03-09 ENCOUNTER — Ambulatory Visit
Admission: RE | Admit: 2020-03-09 | Discharge: 2020-03-09 | Disposition: A | Discharge status: Home / Self Care | Payer: 59 | Attending: General Surgery | Admitting: General Surgery

## 2020-03-09 ENCOUNTER — Encounter
Admission: RE | Disposition: A | Discharge status: Home / Self Care | Payer: Self-pay | Source: Home / Self Care | Attending: General Surgery

## 2020-03-09 ENCOUNTER — Other Ambulatory Visit: Payer: Self-pay

## 2020-03-09 ENCOUNTER — Ambulatory Visit: Payer: 59 | Admitting: Certified Registered"

## 2020-03-09 ENCOUNTER — Encounter: Payer: Self-pay | Admitting: General Surgery

## 2020-03-09 DIAGNOSIS — I700 Atherosclerosis of aorta: Secondary | ICD-10-CM | POA: Insufficient documentation

## 2020-03-09 DIAGNOSIS — Z79899 Other long term (current) drug therapy: Secondary | ICD-10-CM | POA: Insufficient documentation

## 2020-03-09 DIAGNOSIS — N200 Calculus of kidney: Secondary | ICD-10-CM | POA: Insufficient documentation

## 2020-03-09 DIAGNOSIS — Z9842 Cataract extraction status, left eye: Secondary | ICD-10-CM | POA: Insufficient documentation

## 2020-03-09 DIAGNOSIS — F17210 Nicotine dependence, cigarettes, uncomplicated: Secondary | ICD-10-CM | POA: Diagnosis not present

## 2020-03-09 DIAGNOSIS — K810 Acute cholecystitis: Secondary | ICD-10-CM

## 2020-03-09 DIAGNOSIS — Z9889 Other specified postprocedural states: Secondary | ICD-10-CM | POA: Diagnosis not present

## 2020-03-09 DIAGNOSIS — K8000 Calculus of gallbladder with acute cholecystitis without obstruction: Secondary | ICD-10-CM | POA: Diagnosis not present

## 2020-03-09 DIAGNOSIS — K82A1 Gangrene of gallbladder in cholecystitis: Secondary | ICD-10-CM | POA: Diagnosis not present

## 2020-03-09 DIAGNOSIS — Z9841 Cataract extraction status, right eye: Secondary | ICD-10-CM | POA: Insufficient documentation

## 2020-03-09 DIAGNOSIS — K8018 Calculus of gallbladder with other cholecystitis without obstruction: Secondary | ICD-10-CM | POA: Diagnosis not present

## 2020-03-09 DIAGNOSIS — Z961 Presence of intraocular lens: Secondary | ICD-10-CM | POA: Diagnosis not present

## 2020-03-09 DIAGNOSIS — Z8249 Family history of ischemic heart disease and other diseases of the circulatory system: Secondary | ICD-10-CM | POA: Insufficient documentation

## 2020-03-09 LAB — URINE DRUG SCREEN, QUALITATIVE (ARMC ONLY)
Amphetamines, Ur Screen: NOT DETECTED
Barbiturates, Ur Screen: NOT DETECTED
Benzodiazepine, Ur Scrn: NOT DETECTED
Cannabinoid 50 Ng, Ur ~~LOC~~: POSITIVE — AB
Cocaine Metabolite,Ur ~~LOC~~: NOT DETECTED
MDMA (Ecstasy)Ur Screen: NOT DETECTED
Methadone Scn, Ur: NOT DETECTED
Opiate, Ur Screen: NOT DETECTED
Phencyclidine (PCP) Ur S: NOT DETECTED
Tricyclic, Ur Screen: NOT DETECTED

## 2020-03-09 SURGERY — CHOLECYSTECTOMY, ROBOT-ASSISTED, LAPAROSCOPIC
Anesthesia: General | Site: Abdomen

## 2020-03-09 MED ORDER — MIDAZOLAM HCL 2 MG/2ML IJ SOLN
INTRAMUSCULAR | Status: AC
  Filled 2020-03-09: qty 2

## 2020-03-09 MED ORDER — KETOROLAC TROMETHAMINE 30 MG/ML IJ SOLN
INTRAMUSCULAR | Status: AC
  Administered 2020-03-09: 30 mg via INTRAVENOUS
  Filled 2020-03-09: qty 1

## 2020-03-09 MED ORDER — LIDOCAINE HCL (CARDIAC) PF 100 MG/5ML IV SOSY
PREFILLED_SYRINGE | INTRAVENOUS | Status: DC | PRN
  Administered 2020-03-09: 100 mg via INTRAVENOUS

## 2020-03-09 MED ORDER — FAMOTIDINE 20 MG PO TABS
ORAL_TABLET | ORAL | Status: AC
  Administered 2020-03-09: 20 mg via ORAL
  Filled 2020-03-09: qty 1

## 2020-03-09 MED ORDER — MIDAZOLAM HCL 2 MG/2ML IJ SOLN
INTRAMUSCULAR | Status: DC | PRN
  Administered 2020-03-09: 2 mg via INTRAVENOUS

## 2020-03-09 MED ORDER — FENTANYL CITRATE (PF) 100 MCG/2ML IJ SOLN
INTRAMUSCULAR | Status: AC
  Filled 2020-03-09: qty 2

## 2020-03-09 MED ORDER — ONDANSETRON HCL 4 MG/2ML IJ SOLN
INTRAMUSCULAR | Status: DC | PRN
  Administered 2020-03-09 (×2): 4 mg via INTRAVENOUS

## 2020-03-09 MED ORDER — TISSEEL VH 10 ML EX KIT
PACK | CUTANEOUS | Status: DC | PRN
  Administered 2020-03-09: 1

## 2020-03-09 MED ORDER — PROPOFOL 10 MG/ML IV BOLUS
INTRAVENOUS | Status: AC
  Filled 2020-03-09: qty 40

## 2020-03-09 MED ORDER — SUGAMMADEX SODIUM 500 MG/5ML IV SOLN
INTRAVENOUS | Status: DC | PRN
  Administered 2020-03-09: 400 mg via INTRAVENOUS

## 2020-03-09 MED ORDER — FENTANYL CITRATE (PF) 100 MCG/2ML IJ SOLN
INTRAMUSCULAR | Status: DC | PRN
  Administered 2020-03-09: 100 ug via INTRAVENOUS
  Administered 2020-03-09 (×4): 50 ug via INTRAVENOUS

## 2020-03-09 MED ORDER — SODIUM CHLORIDE 0.9 % IV SOLN
INTRAVENOUS | Status: DC | PRN
  Administered 2020-03-09: 10 ug/min via INTRAVENOUS

## 2020-03-09 MED ORDER — BUPIVACAINE HCL (PF) 0.25 % IJ SOLN
INTRAMUSCULAR | Status: AC
  Filled 2020-03-09: qty 30

## 2020-03-09 MED ORDER — LIDOCAINE-EPINEPHRINE 1 %-1:100000 IJ SOLN
INTRAMUSCULAR | Status: DC | PRN
  Administered 2020-03-09: 10 mL

## 2020-03-09 MED ORDER — LIDOCAINE-EPINEPHRINE 1 %-1:100000 IJ SOLN
INTRAMUSCULAR | Status: AC
  Filled 2020-03-09: qty 1

## 2020-03-09 MED ORDER — ROCURONIUM BROMIDE 100 MG/10ML IV SOLN
INTRAVENOUS | Status: DC | PRN
  Administered 2020-03-09 (×3): 20 mg via INTRAVENOUS
  Administered 2020-03-09: 10 mg via INTRAVENOUS
  Administered 2020-03-09: 50 mg via INTRAVENOUS

## 2020-03-09 MED ORDER — GABAPENTIN 300 MG PO CAPS
ORAL_CAPSULE | ORAL | Status: AC
  Administered 2020-03-09: 300 mg via ORAL
  Filled 2020-03-09: qty 1

## 2020-03-09 MED ORDER — KETOROLAC TROMETHAMINE 30 MG/ML IJ SOLN: 30.0000 mg | Freq: Once | INTRAMUSCULAR | Status: AC

## 2020-03-09 MED ORDER — OXYCODONE HCL 5 MG PO TABS: 5.0000 mg | ORAL_TABLET | Freq: Four times a day (QID) | ORAL | 0 refills | Status: DC | PRN

## 2020-03-09 MED ORDER — DEXMEDETOMIDINE (PRECEDEX) IN NS 20 MCG/5ML (4 MCG/ML) IV SYRINGE
PREFILLED_SYRINGE | INTRAVENOUS | Status: DC | PRN
  Administered 2020-03-09: 12 ug via INTRAVENOUS
  Administered 2020-03-09: 8 ug via INTRAVENOUS

## 2020-03-09 MED ORDER — PROPOFOL 500 MG/50ML IV EMUL
INTRAVENOUS | Status: AC
  Filled 2020-03-09: qty 50

## 2020-03-09 MED ORDER — ACETAMINOPHEN 500 MG PO TABS: 1000.0000 mg | ORAL_TABLET | Freq: Four times a day (QID) | ORAL | 0 refills | Status: AC | PRN

## 2020-03-09 MED ORDER — GLYCOPYRROLATE 0.2 MG/ML IJ SOLN
INTRAMUSCULAR | Status: DC | PRN
  Administered 2020-03-09: .2 mg via INTRAVENOUS

## 2020-03-09 MED ORDER — LABETALOL HCL 5 MG/ML IV SOLN
INTRAVENOUS | Status: DC | PRN
  Administered 2020-03-09 (×2): 10 mg via INTRAVENOUS
  Administered 2020-03-09: 5 mg via INTRAVENOUS

## 2020-03-09 MED ORDER — ACETAMINOPHEN 500 MG PO TABS
ORAL_TABLET | ORAL | Status: AC
  Administered 2020-03-09: 1000 mg via ORAL
  Filled 2020-03-09: qty 2

## 2020-03-09 MED ORDER — PROPOFOL 10 MG/ML IV BOLUS
INTRAVENOUS | Status: DC | PRN
  Administered 2020-03-09: 50 mg via INTRAVENOUS
  Administered 2020-03-09: 150 mg via INTRAVENOUS

## 2020-03-09 MED ORDER — PROMETHAZINE HCL 25 MG/ML IJ SOLN
6.2500 mg | INTRAMUSCULAR | Status: DC | PRN
Start: 2020-03-09 — End: 2020-03-09

## 2020-03-09 MED ORDER — AMOXICILLIN-POT CLAVULANATE 875-125 MG PO TABS: 1.0000 | ORAL_TABLET | Freq: Two times a day (BID) | ORAL | 0 refills | Status: AC

## 2020-03-09 MED ORDER — IBUPROFEN 800 MG PO TABS: 800.0000 mg | ORAL_TABLET | Freq: Three times a day (TID) | ORAL | 0 refills | Status: DC | PRN

## 2020-03-09 MED ORDER — FENTANYL CITRATE (PF) 100 MCG/2ML IJ SOLN
25.0000 ug | INTRAMUSCULAR | Status: DC | PRN
  Administered 2020-03-09: 50 ug via INTRAVENOUS

## 2020-03-09 MED ORDER — CHLORHEXIDINE GLUCONATE 0.12 % MT SOLN
OROMUCOSAL | Status: AC
  Administered 2020-03-09: 15 mL via OROMUCOSAL
  Filled 2020-03-09: qty 15

## 2020-03-09 MED ORDER — DEXAMETHASONE SODIUM PHOSPHATE 10 MG/ML IJ SOLN
INTRAMUSCULAR | Status: DC | PRN
  Administered 2020-03-09: 10 mg via INTRAVENOUS

## 2020-03-09 MED ORDER — CELECOXIB 200 MG PO CAPS
ORAL_CAPSULE | ORAL | Status: AC
  Administered 2020-03-09: 200 mg via ORAL
  Filled 2020-03-09: qty 1

## 2020-03-09 MED ORDER — OXYCODONE HCL 5 MG PO TABS
ORAL_TABLET | ORAL | Status: AC
  Administered 2020-03-09: 5 mg via ORAL
  Filled 2020-03-09: qty 1

## 2020-03-09 MED ORDER — SODIUM CHLORIDE 0.9 % IV SOLN
INTRAVENOUS | Status: AC
  Filled 2020-03-09: qty 2

## 2020-03-09 MED ORDER — OXYCODONE HCL 5 MG PO TABS: 5.0000 mg | ORAL_TABLET | Freq: Once | ORAL | Status: AC

## 2020-03-09 MED ORDER — EPHEDRINE SULFATE 50 MG/ML IJ SOLN
INTRAMUSCULAR | Status: DC | PRN
  Administered 2020-03-09: 5 mg via INTRAVENOUS

## 2020-03-09 MED ORDER — PHENYLEPHRINE HCL (PRESSORS) 10 MG/ML IV SOLN
INTRAVENOUS | Status: DC | PRN
  Administered 2020-03-09 (×3): 100 ug via INTRAVENOUS

## 2020-03-09 SURGICAL SUPPLY — 68 items
ADH SKN CLS APL DERMABOND .7 (GAUZE/BANDAGES/DRESSINGS) ×1
APL LAPSCP 35 DL APL RGD (MISCELLANEOUS) ×1
APL PRP STRL LF DISP 70% ISPRP (MISCELLANEOUS) ×1
APPLICATOR VISTASEAL 35 (MISCELLANEOUS) ×2 IMPLANT
BAG SPEC RTRVL LRG 6X4 10 (ENDOMECHANICALS) ×1
BLADE SURG SZ11 CARB STEEL (BLADE) ×2 IMPLANT
BULB RESERV EVAC DRAIN JP 100C (MISCELLANEOUS) ×2 IMPLANT
CANISTER SUCT 1200ML W/VALVE (MISCELLANEOUS) ×2 IMPLANT
CANNULA REDUC XI 12-8 STAPL (CANNULA) ×1
CANNULA REDUCER 12-8 DVNC XI (CANNULA) ×1 IMPLANT
CHLORAPREP W/TINT 26 (MISCELLANEOUS) ×2 IMPLANT
CLIP VESOLOCK MED LG 6/CT (CLIP) ×2 IMPLANT
COVER TIP SHEARS 8 DVNC (MISCELLANEOUS) ×1 IMPLANT
COVER TIP SHEARS 8MM DA VINCI (MISCELLANEOUS) ×1
COVER WAND RF STERILE (DRAPES) ×2 IMPLANT
DECANTER SPIKE VIAL GLASS SM (MISCELLANEOUS) ×4 IMPLANT
DEFOGGER SCOPE WARMER CLEARIFY (MISCELLANEOUS) ×2 IMPLANT
DERMABOND ADVANCED (GAUZE/BANDAGES/DRESSINGS) ×1
DERMABOND ADVANCED .7 DNX12 (GAUZE/BANDAGES/DRESSINGS) ×1 IMPLANT
DRAIN CHANNEL JP 19F (MISCELLANEOUS) ×2 IMPLANT
DRAPE ARM DVNC X/XI (DISPOSABLE) ×4 IMPLANT
DRAPE COLUMN DVNC XI (DISPOSABLE) ×1 IMPLANT
DRAPE DA VINCI XI ARM (DISPOSABLE) ×4
DRAPE DA VINCI XI COLUMN (DISPOSABLE) ×1
ELECT CAUTERY BLADE TIP 2.5 (TIP) ×2
ELECT REM PT RETURN 9FT ADLT (ELECTROSURGICAL) ×2
ELECTRODE CAUTERY BLDE TIP 2.5 (TIP) ×1 IMPLANT
ELECTRODE REM PT RTRN 9FT ADLT (ELECTROSURGICAL) ×1 IMPLANT
GLOVE INDICATOR 7.0 STRL GRN (GLOVE) ×4 IMPLANT
GLOVE SURG ENC MOIS LTX SZ6.5 (GLOVE) ×4 IMPLANT
GOWN STRL REUS W/ TWL LRG LVL3 (GOWN DISPOSABLE) ×3 IMPLANT
GOWN STRL REUS W/TWL LRG LVL3 (GOWN DISPOSABLE) ×6
GRASPER SUT TROCAR 14GX15 (MISCELLANEOUS) ×2 IMPLANT
HEMOSTAT SURGICEL 2X3 (HEMOSTASIS) ×2 IMPLANT
IRRIGATOR SUCT 8 DISP DVNC XI (IRRIGATION / IRRIGATOR) ×1 IMPLANT
IRRIGATOR SUCTION 8MM XI DISP (IRRIGATION / IRRIGATOR) ×1
IV NS 1000ML (IV SOLUTION) ×4
IV NS 1000ML BAXH (IV SOLUTION) ×2 IMPLANT
KIT PINK PAD W/HEAD ARE REST (MISCELLANEOUS) ×2
KIT PINK PAD W/HEAD ARM REST (MISCELLANEOUS) ×1 IMPLANT
LABEL OR SOLS (LABEL) ×2 IMPLANT
MANIFOLD NEPTUNE II (INSTRUMENTS) ×2 IMPLANT
NEEDLE HYPO 22GX1.5 SAFETY (NEEDLE) ×2 IMPLANT
NEEDLE INSUFFLATION 14GA 120MM (NEEDLE) ×2 IMPLANT
NS IRRIG 500ML POUR BTL (IV SOLUTION) ×4 IMPLANT
OBTURATOR OPTICAL STANDARD 8MM (TROCAR) ×1
OBTURATOR OPTICAL STND 8 DVNC (TROCAR) ×1
OBTURATOR OPTICALSTD 8 DVNC (TROCAR) ×1 IMPLANT
PACK LAP CHOLECYSTECTOMY (MISCELLANEOUS) ×2 IMPLANT
PENCIL ELECTRO HAND CTR (MISCELLANEOUS) ×2 IMPLANT
POUCH SPECIMEN RETRIEVAL 10MM (ENDOMECHANICALS) ×2 IMPLANT
SEAL CANN UNIV 5-8 DVNC XI (MISCELLANEOUS) ×3 IMPLANT
SEAL XI 5MM-8MM UNIVERSAL (MISCELLANEOUS) ×3
SET TUBE SMOKE EVAC HIGH FLOW (TUBING) ×2 IMPLANT
SOLUTION ELECTROLUBE (MISCELLANEOUS) ×2 IMPLANT
STAPLER CANNULA SEAL DVNC XI (STAPLE) ×1 IMPLANT
STAPLER CANNULA SEAL XI (STAPLE) ×1
STRIP CLOSURE SKIN 1/2X4 (GAUZE/BANDAGES/DRESSINGS) ×2 IMPLANT
SUT ETHILON 3-0 FS-10 30 BLK (SUTURE) ×2
SUT MNCRL 4-0 (SUTURE) ×2
SUT MNCRL 4-0 27XMFL (SUTURE) ×1
SUT VIC AB 3-0 SH 27 (SUTURE)
SUT VIC AB 3-0 SH 27X BRD (SUTURE) IMPLANT
SUT VICRYL 0 AB UR-6 (SUTURE) IMPLANT
SUTURE EHLN 3-0 FS-10 30 BLK (SUTURE) ×1 IMPLANT
SUTURE MNCRL 4-0 27XMF (SUTURE) ×1 IMPLANT
TOWEL OR 17X26 4PK STRL BLUE (TOWEL DISPOSABLE) ×2 IMPLANT
TROCAR XCEL NON-BLD 5MMX100MML (ENDOMECHANICALS) IMPLANT

## 2020-03-09 NOTE — Anesthesia Preprocedure Evaluation (Signed)
Anesthesia Evaluation  Patient identified by MRN, date of birth, ID band Patient awake    Reviewed: Allergy & Precautions, NPO status , Patient's Chart, lab work & pertinent test results  History of Anesthesia Complications Negative for: history of anesthetic complications  Airway Mallampati: II  TM Distance: >3 FB Neck ROM: Full    Dental  (+) Upper Dentures, Lower Dentures   Pulmonary neg shortness of breath, neg sleep apnea, neg COPD, neg recent URI, Current Smoker,    breath sounds clear to auscultation- rhonchi (-) wheezing      Cardiovascular Exercise Tolerance: Good hypertension, (-) angina(-) CAD, (-) Past MI, (-) Cardiac Stents and (-) CABG (-) dysrhythmias  Rhythm:Regular Rate:Normal - Systolic murmurs and - Diastolic murmurs    Neuro/Psych neg Seizures PSYCHIATRIC DISORDERS Depression negative neurological ROS     GI/Hepatic negative GI ROS, Neg liver ROS,   Endo/Other  negative endocrine ROSneg diabetes  Renal/GU negative Renal ROS     Musculoskeletal  (+) Arthritis ,   Abdominal (+) - obese,   Peds  Hematology negative hematology ROS (+)   Anesthesia Other Findings Past Medical History: No date: Arthritis     Comment:  hands & knees No date: Depression No date: Dyspnea     Comment:  DOE   Reproductive/Obstetrics                             Anesthesia Physical  Anesthesia Plan  ASA: II  Anesthesia Plan: General   Post-op Pain Management:    Induction: Intravenous  PONV Risk Score and Plan: 0 and Dexamethasone and Ondansetron  Airway Management Planned: Oral ETT  Additional Equipment:   Intra-op Plan:   Post-operative Plan: Extubation in OR  Informed Consent: I have reviewed the patients History and Physical, chart, labs and discussed the procedure including the risks, benefits and alternatives for the proposed anesthesia with the patient or authorized  representative who has indicated his/her understanding and acceptance.       Plan Discussed with: CRNA and Anesthesiologist  Anesthesia Plan Comments:         Anesthesia Quick Evaluation

## 2020-03-09 NOTE — Anesthesia Procedure Notes (Signed)
Procedure Name: Intubation Performed by: Fletcher-Harrison, Dayton Kenley, CRNA Pre-anesthesia Checklist: Patient identified, Emergency Drugs available, Suction available and Patient being monitored Patient Re-evaluated:Patient Re-evaluated prior to induction Oxygen Delivery Method: Circle system utilized Preoxygenation: Pre-oxygenation with 100% oxygen Induction Type: IV induction Ventilation: Mask ventilation without difficulty Laryngoscope Size: McGraph and 3 Grade View: Grade I Tube type: Oral Tube size: 7.0 mm Number of attempts: 1 Airway Equipment and Method: Stylet and Oral airway Placement Confirmation: ETT inserted through vocal cords under direct vision,  positive ETCO2,  breath sounds checked- equal and bilateral and CO2 detector Secured at: 21 cm Tube secured with: Tape Dental Injury: Teeth and Oropharynx as per pre-operative assessment        

## 2020-03-09 NOTE — Transfer of Care (Signed)
Immediate Anesthesia Transfer of Care Note  Patient: Dean Dominguez  Procedure(s) Performed: XI ROBOTIC ASSISTED LAPAROSCOPIC CHOLECYSTECTOMY (N/A Abdomen)  Patient Location: PACU  Anesthesia Type:General  Level of Consciousness: awake, drowsy and patient cooperative  Airway & Oxygen Therapy: Patient Spontanous Breathing and Patient connected to face mask oxygen  Post-op Assessment: Report given to RN and Post -op Vital signs reviewed and stable  Post vital signs: Reviewed and stable  Last Vitals:  Vitals Value Taken Time  BP 125/65 03/09/20 1247  Temp 36.2 C 03/09/20 1247  Pulse 68 03/09/20 1251  Resp 15 03/09/20 1251  SpO2 99 % 03/09/20 1251  Vitals shown include unvalidated device data.  Last Pain:  Vitals:   03/09/20 0619  TempSrc: Oral  PainSc: 0-No pain         Complications: No complications documented.

## 2020-03-09 NOTE — Op Note (Signed)
Operative note  Pre-operative Diagnosis: Gangrenous cholecystitis, status post percutaneous cholecystostomy tube placement  Post-operative Diagnosis: Same  Procedure: Robot assisted laparoscopic cholecystectomy  Surgeon: [name], MD  Anesthesia: GETA  Findings: The tissue of the gallbladder itself was extremely friable and tore with minimal manipulation.  There were multiple extremely large stones within the gallbladder but spilled out when the gallbladder wall tore.  There was extensive scarring and inflammation around the triangle of Calot and gallbladder infundibulum which made visualization of biliary strictures exceptionally difficult.  ICG illumination was critical to the safe performance of this procedure.  Estimated Blood Loss: 30 cc       Specimens: Gallbladder           Complications: none immediately apparent  Procedure In Detail: The patient was seen again in the holding room. The benefits, complications, treatment options, and expected outcomes were discussed with the patient. The risks of bleeding, infection, recurrence of symptoms, failure to resolve symptoms, bile duct damage, bile duct leak, retained common bile duct stone, bowel injury, any of which could require further surgery and/or ERCP, stent, or papillotomy were reviewed with the patient. The likelihood of improving the patient's symptoms with return to their baseline status is good.  The patient and/or family concurred with the proposed plan, giving informed consent.  The patient was taken to operating room, identified and the procedure verified as laparoscopic cholecystectomy.  A time out was held and the above information confirmed.  Prior to the induction of general anesthesia, antibiotic prophylaxis was administered. VTE prophylaxis was in place. General endotracheal anesthesia was then administered and tolerated well. After the induction, the patient's pre-existing cholecystostomy tube drain was removed.   The abdomen was prepped with Chloraprep and draped in the sterile fashion. The patient was positioned in the supine position.  The Veress needle was used to enter the abdomen at Palmer's point.  Intra-abdominal placement was confirmed via 2 clicks and the saline meniscus drop test.  Pneumoperitoneum was created with carbon dioxide and tolerated well.  Optiview technique was used to enter the abdomen in the in the right lower quadrant, just medial to the umbilicus via an 8 mm robotic trocar.  A 12 mm robotic trocar along with 2 additional 8-mm robotic trochars were placed under direct vision.  All skin incisions were infiltrated with a local anesthetic agent before making the incision and placing the trocars.   The patient was positioned in 15 degrees of reverse Trendelenburg and tilted 10 degrees to the left.  The robot was brought to the surgical field and docked in the standard fashion.  We made sure all the instrumentation was kept in direct view at all times and that there were no collision between the arms.  I scrubbed out and went to the console.  There was significant scarring of the omentum to the anterior abdominal wall and to the liver, as this had been a transhepatic drain.  The adhesions were carefully lysed with a combination of blunt dissection and the monopolar scissors.  The gallbladder was identified.  As I attempted to grab the fundus, the tissue tore, opening the wall of the gallbladder to the point that roughly 12-15 large stones spilled into the abdominal cavity.  These were carefully tucked under the liver edge to be retrieved once the gallbladder was out.  As the gallbladder tore any time it was grasped, I use the prograsp shaft to simply lift the liver to expose the gallbladder.  Over 2 hours were required  to lyse the adhesions between the omentum and the gallbladder, the bowel and the gallbladder, and those surrounding the critical biliary structures.  At one point, I considered a  top-down approach and partially dissected the gallbladder off the liver at the top.  Due to being severely intrahepatic, this led to a moderate amount of bleeding that was controlled with Surgicel.  I then considered simply fenestrating the gallbladder due to the inflammation and began to open the gallbladder wall, which obliged my efforts by tearing easily allowing more stones to spill into the abdominal cavity.  I ultimately reverted back to a traditional approach, cautiously using the suction irrigator and monopolar scissors to tease away tissue such that structures were ultimately able to be defined.  ICG cholangiography was critical to this portion of the procedure, as I could clearly see the common bile duct, but was unable to see the cystic duct for roughly an hour while I performed the dissection.  Ultimately, after several hours of dissection, an extended critical view of the cystic duct and cystic artery was obtained.  The cystic duct was clearly identified and bluntly dissected away from the surrounding tissues, as was the cystic artery.  Using ICG cholangiography we visualized the cystic duct and confirmed that there was no aberrant biliary ductal anatomy nor any evidence of bile duct injury.  Both cystic duct and cystic artery were clipped and divided. The gallbladder was taken from the gallbladder fossa in a retrograde fashion with the electrocautery, taking care to include the posterior wall.  Hemostasis was achieved with the electrocautery. Inspection of the right upper quadrant was performed. No bleeding, bile duct injury or leak, or bowel injury was noted.  The many stones that had spilled were retrieved and placed along with the gallbladder in an Endopouch bag.  The right upper quadrant was irrigated extensively until the fluid returned clear.  Several additional gallstones that had not been retrieved and placed in the Endopouch bag were then crushed and aspirated with the robotic suction  irrigator.  Once we could find no additional stones, Vistaseal was applied to the liver bed.  The robotic instruments were removed and robotic arms were undocked in the standard fashion.    I scrubbed back in.  The gallbladder was removed via the 12 mm trocar site.  Using the PMI and a Carter-Thomason cone, the 12 mm port site was then closed with interrupted 0 Vicryl sutures.  I then placed a #19 round Blake drain in the liver bed and brought it out through the left lower quadrant trocar site.  The remaining 8 mm ports were removed and pneumoperitoneum was released.  Each port site was closed with deep dermal 3-0 Vicryl.  4-0 subcuticular Monocryl was used to close the skin. Dermabond was applied, followed by Steri-Strips.  In and out catheterization was performed due to the extensive length of the case.  The patient was then awakened, extubated, and taken to the postanesthesia recovery unit in stable condition.   Sponge, lap, and needle counts were reported to be correct number at closure and at the conclusion of the case.          [name], MD FACS

## 2020-03-09 NOTE — Anesthesia Postprocedure Evaluation (Signed)
Anesthesia Post Note  Patient: Dean Dominguez  Procedure(s) Performed: XI ROBOTIC ASSISTED LAPAROSCOPIC CHOLECYSTECTOMY (N/A Abdomen)  Patient location during evaluation: PACU Anesthesia Type: General Level of consciousness: awake and alert Pain management: pain level controlled Vital Signs Assessment: post-procedure vital signs reviewed and stable Respiratory status: spontaneous breathing, nonlabored ventilation, respiratory function stable and patient connected to nasal cannula oxygen Cardiovascular status: blood pressure returned to baseline and stable Postop Assessment: no apparent nausea or vomiting Anesthetic complications: no   No complications documented.   Last Vitals:  Vitals:   03/09/20 1415 03/09/20 1427  BP: 108/70 131/64  Pulse: 78 73  Resp: 13 20  Temp:  36.5 C  SpO2: 95% 96%    Last Pain:  Vitals:   03/09/20 1427  TempSrc: Temporal  PainSc: 9                  Joseph K Piscitello

## 2020-03-09 NOTE — Interval H&P Note (Signed)
History and Physical Interval Note:  03/09/2020 7:14 AM  Dean Dominguez  has presented today for surgery, with the diagnosis of cholecystitis.  The various methods of treatment have been discussed with the patient and family. After consideration of risks, benefits and other options for treatment, the patient has consented to  Procedure(s): XI ROBOTIC ASSISTED LAPAROSCOPIC CHOLECYSTECTOMY (N/A) as a surgical intervention.  The patient's history has been reviewed, patient examined, no change in status, stable for surgery.  I have reviewed the patient's chart and labs.  Questions were answered to the patient's satisfaction.     [name]

## 2020-03-12 LAB — SURGICAL PATHOLOGY

## 2020-03-20 ENCOUNTER — Telehealth: Payer: Self-pay | Admitting: *Deleted

## 2020-03-20 NOTE — Telephone Encounter (Signed)
Faxed FMLA to [employer] at [phone]

## 2020-03-23 ENCOUNTER — Ambulatory Visit (INDEPENDENT_AMBULATORY_CARE_PROVIDER_SITE_OTHER): Payer: 59 | Admitting: Physician Assistant

## 2020-03-23 ENCOUNTER — Other Ambulatory Visit: Payer: Self-pay

## 2020-03-23 ENCOUNTER — Encounter: Payer: Self-pay | Admitting: Physician Assistant

## 2020-03-23 VITALS — BP 175/75 | HR 74 | Temp 98.2°F | Ht 70.0 in | Wt 161.0 lb

## 2020-03-23 DIAGNOSIS — Z09 Encounter for follow-up examination after completed treatment for conditions other than malignant neoplasm: Secondary | ICD-10-CM

## 2020-03-23 DIAGNOSIS — K810 Acute cholecystitis: Secondary | ICD-10-CM

## 2020-03-23 DIAGNOSIS — G510 Bell's palsy: Secondary | ICD-10-CM | POA: Insufficient documentation

## 2020-03-23 NOTE — Patient Instructions (Addendum)
We removed the drain today. Please keep a dry dressing over the area for 2 days or until the area heals completely.  You may remove the dressing shower as usual and replace a dry dressing over the area. No heavy lifting greater than 10-15 pounds for 2 more weeks.

## 2020-03-23 NOTE — Progress Notes (Signed)
[facility] POST-OP OFFICE VISIT  03/23/2020  HPI: Dean Dominguez is a 60 y.o. male 14 days s/p robotic assisted laparoscopic cholecystectomy for gangrenous cholecystitis with Dr Lady Gary.   He is overall doing well Only with very mild soreness, well controlled with Tylenol alone No fever, chills, nausea, emesis, diarrhea Drain in LLQ with seorus output, <30 ccs daily No other issues   Vital signs: BP (!) 175/75   Pulse 74   Temp 98.2 F (36.8 C) (Oral)   Ht 5\' 10"  (1.778 m)   Wt 161 lb (73 kg)   SpO2 99%   BMI 23.10 kg/m    Physical Exam: Constitutional: Well appearing male, NAD Abdomen: Soft, non-tender, non-distended, no rebound/guarding, surgical drain with serous drainage (removed)  Skin: Laparoscopic incisions have healed well, no erythema or drainage   Assessment/Plan: This is a 60 y.o. male 14 days s/p robotic assisted laparoscopic cholecystectomy for gangrenous cholecystitis   - I removed drain at bedside; occlusive dressing placed  - Pain control prn; tylenol prn  - reviewed lifting restrictions; note provided  - Reviewed wound care  - Reviewed surgical pathology  - He can follow up prn   -- 75, PA-C [facility] 03/23/2020, 11:02 AM [phone] M-F: 7am - 4pm

## 2020-05-03 DIAGNOSIS — F17200 Nicotine dependence, unspecified, uncomplicated: Secondary | ICD-10-CM | POA: Diagnosis present

## 2020-11-14 ENCOUNTER — Encounter: Payer: Self-pay | Admitting: General Surgery

## 2021-08-22 ENCOUNTER — Emergency Department
Admission: EM | Admit: 2021-08-22 | Discharge: 2021-08-22 | Disposition: A | Discharge status: Home / Self Care | Payer: 59 | Attending: Emergency Medicine | Admitting: Emergency Medicine

## 2021-08-22 ENCOUNTER — Other Ambulatory Visit: Payer: Self-pay

## 2021-08-22 DIAGNOSIS — R451 Restlessness and agitation: Secondary | ICD-10-CM | POA: Diagnosis not present

## 2021-08-22 DIAGNOSIS — F419 Anxiety disorder, unspecified: Secondary | ICD-10-CM | POA: Insufficient documentation

## 2021-08-22 DIAGNOSIS — I10 Essential (primary) hypertension: Secondary | ICD-10-CM | POA: Diagnosis not present

## 2021-08-22 MED ORDER — HYDROXYZINE PAMOATE 50 MG PO CAPS: 50.0000 mg | ORAL_CAPSULE | Freq: Three times a day (TID) | ORAL | 0 refills | Status: AC | PRN

## 2021-08-22 NOTE — ED Triage Notes (Addendum)
Pt states that he has been struggling with his anxiety and stress for the last few days- pt states that he did not take his venlafaxine- pt states that he has had a lot of problems at home- pt states he has been home the last couple days- pt denies SI/HI

## 2021-08-22 NOTE — ED Provider Notes (Signed)
[facility] Provider Note    Event Date/Time   First MD Initiated Contact with Patient 08/22/21 1532     (approximate)   History   Chief Complaint Anxiety   HPI Dean Dominguez is a 61 y.o. male, history of hypertension, depression, anxiety, hyperlipidemia, presents to the emergency department for evaluation of anxiety.  Patient states that he has been under a lot of stress over the past few days because he has been trying to help a friend do things like get her license, Social Security card, and a job, though feels like she is not putting any effort to do these things.  He has been so stressed that he has forgotten to take his venlafaxine over the past few days, though did have his first dose today and is feeling somewhat back to his " equilibrium".  However, he says that he thinks that he needs something else to help take the edge off.  Denies SI/HI, visual hallucinations, auditory hallucinations, fever/chills, chest pain, shortness of breath, abdominal pain, nausea/vomiting, or dizziness/lightheadedness.  Denies any recent drug use.  History Limitations: No limitations.        Physical Exam  Triage Vital Signs: ED Triage Vitals  Enc Vitals Group     BP 08/22/21 1228 (!) 186/98     Pulse Rate 08/22/21 1228 77     Resp 08/22/21 1228 20     Temp 08/22/21 1228 98.3 F (36.8 C)     Temp Source 08/22/21 1228 Oral     SpO2 08/22/21 1228 98 %     Weight 08/22/21 1555 160 lb 15 oz (73 kg)     Height 08/22/21 1555 5\' 10"  (1.778 m)     Head Circumference --      Peak Flow --      Pain Score 08/22/21 1226 0     Pain Loc --      Pain Edu? --      Excl. in GC? --     Most recent vital signs: Vitals:   08/22/21 1228 08/22/21 1556  BP: (!) 186/98 (!) 180/90  Pulse: 77 80  Resp: 20 20  Temp: 98.3 F (36.8 C) 98 F (36.7 C)  SpO2: 98% 98%    General: Awake, appears anxious and agitated Skin: Warm, dry. No rashes or lesions.  Eyes: PERRL.  Conjunctivae normal.  CV: Good peripheral perfusion.  Resp: Normal effort.  Abd: Soft, non-tender. No distention.  Neuro: At baseline. No gross neurological deficits.   Focused Exam: N/A.  Physical Exam    ED Results / Procedures / Treatments  Labs (all labs ordered are listed, but only abnormal results are displayed) Labs Reviewed - No data to display   EKG N/A   RADIOLOGY  ED Provider Interpretation: N/A.  No results found.  PROCEDURES:  Critical Care performed: N/A.  Procedures    MEDICATIONS ORDERED IN ED: Medications - No data to display   IMPRESSION / MDM / ASSESSMENT AND PLAN / ED COURSE  I reviewed the triage vital signs and the nursing notes.                              Differential diagnosis includes, but is not limited to, anxiety, depression, panic sorter  Assessment/Plan Patient presents with self-reported anxiety x3 days secondary to recent stress with his friend and likely exacerbated by noncompliance with venlafaxine.  He appears clinically well, though is  notably moderately anxious and agitated.  Low suspicion for any serious or life-threatening pathology given his history and physical exam.  He is currently back on his dose of venlafaxine.  Reports that he has a regular doctor and a psychiatrist that he will be seeing soon within the next few weeks.  We will provide him with a prescription for hydroxyzine to help manage acute episodes of anxiety.  Patient is very satisfied with this.  We will plan to discharge.  Patient's presentation is most consistent with acute, uncomplicated illness.   Provided the patient with anticipatory guidance, return precautions, and educational material. Encouraged the patient to return to the emergency department at any time if they begin to experience any new or worsening symptoms. Patient expressed understanding and agreed with the plan.       FINAL CLINICAL IMPRESSION(S) / ED DIAGNOSES   Final diagnoses:   Anxiety     Rx / DC Orders   ED Discharge Orders          Ordered    hydrOXYzine (VISTARIL) 50 MG capsule  3 times daily PRN        08/22/21 1629             Note:  This document was prepared using Dragon voice recognition software and may include unintentional dictation errors.   Varney Daily, [place] 08/22/21 1637    Chesley Noon, MD 08/22/21 [phone]

## 2021-08-22 NOTE — ED Provider Triage Note (Signed)
Emergency Medicine Provider Triage Evaluation Note  Dean Dominguez , a 61 y.o. male  was evaluated in triage.  Pt complains of anxiety.  Patient states that he did forget to take his venlafaxine and it "throws equilibrium off".  Patient states that he has not been able to work the past 2 days and his employer recommended that he be evaluated in the emergency department.  He states that he has been too stressed to go to work.  He states that he is primarily stressed as he has been trying to help a friend who is struggling with recovery and she is not taking his advice.  He denies suicidal or homicidal ideation, stating that he just wants to have help with his nervousness.  Review of Systems  Positive: Patient has anxiety.  Negative: No chest pain or abdominal pain.  Physical Exam  There were no vitals taken for this visit. Gen:   Awake, no distress   Resp:  Normal effort  MSK:   Moves extremities without difficulty  Other:    Medical Decision Making  Medically screening exam initiated at 12:30 PM.  Appropriate orders placed.  Dean Dominguez was informed that the remainder of the evaluation will be completed by another provider, this initial triage assessment does not replace that evaluation, and the importance of remaining in the ED until their evaluation is complete.     Pia Mau [place], [place] 08/22/21 1232

## 2021-08-22 NOTE — Discharge Instructions (Addendum)
-  You may take your hydroxyzine as prescribed for acute onsets of anxiety.  Please continue to take your venlafaxine daily as well.  -Follow-up with your primary care provider and psychiatrist, as discussed.  -Return to the emergency department anytime if you begin to experience any new or worsening symptoms.

## 2021-10-10 IMAGING — US US ABDOMEN LIMITED
1 series · 14 of 25 positions shown · non-contrast
Comparison: None.

CLINICAL DATA: Epigastric pain, vomiting

EXAM:
ULTRASOUND ABDOMEN LIMITED RIGHT UPPER QUADRANT

[Series 1: us abdomen limited ruq (liver/gb) · 14 of 56 slices shown]
[im 1/56]
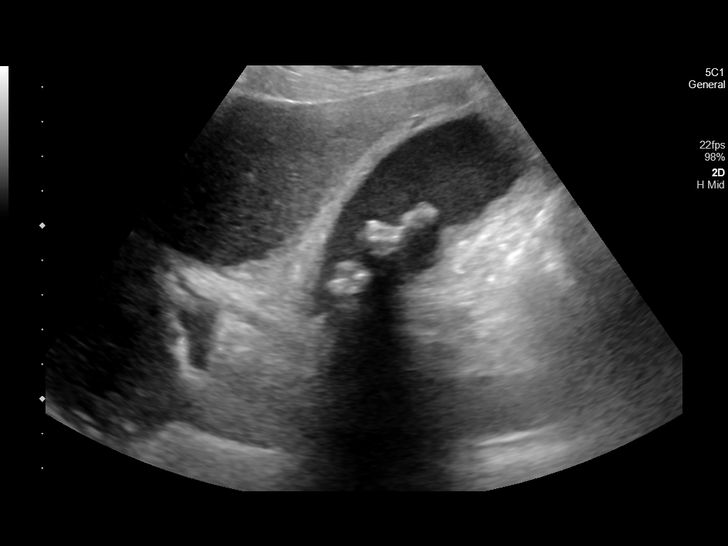
[im 5/56]
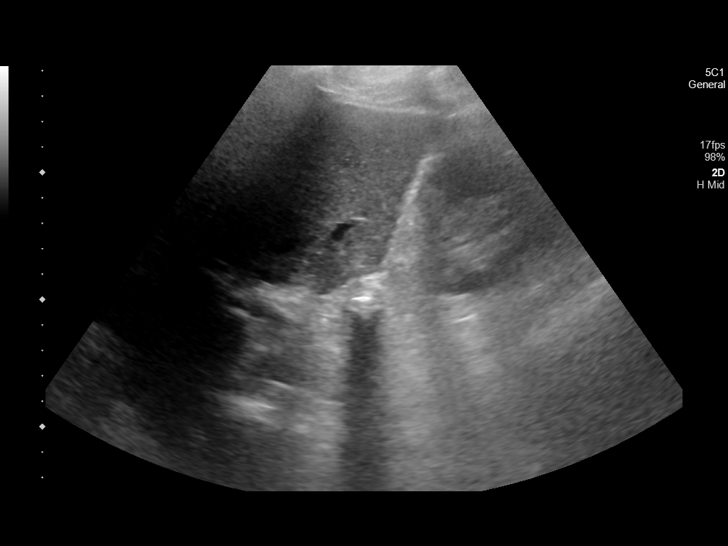
[im 10/56]
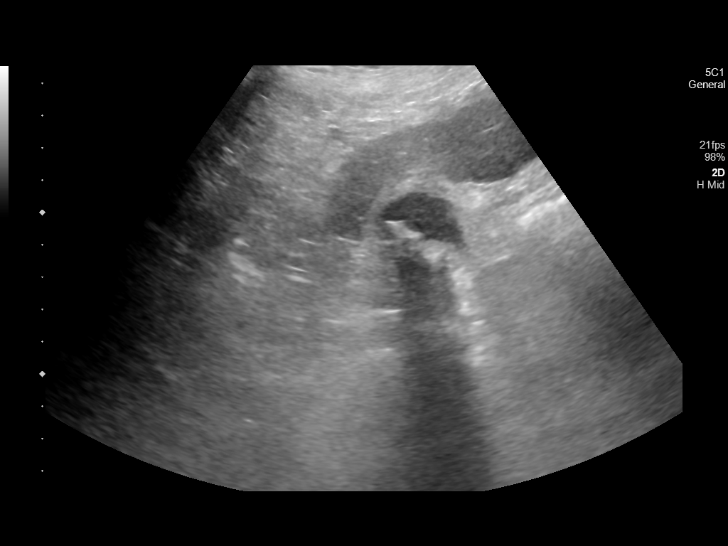
[im 14/56]
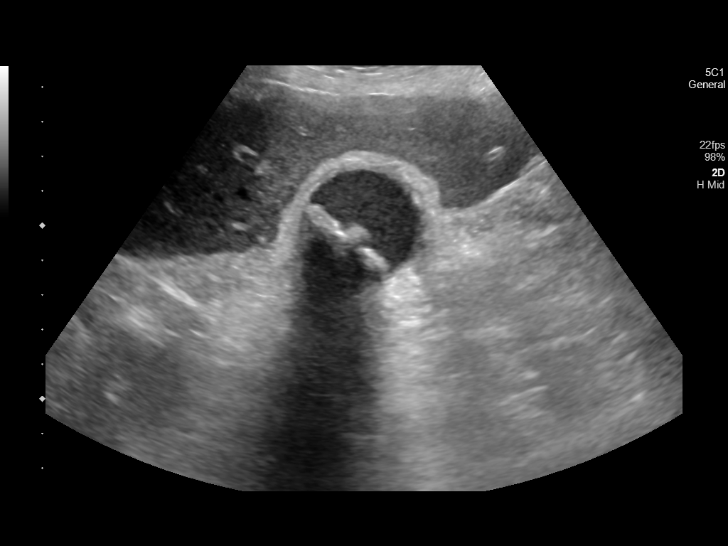
[im 19/56]
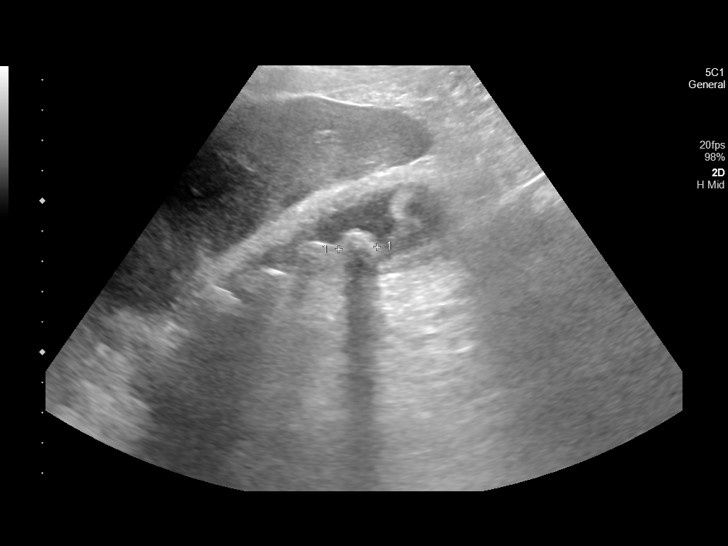
[im 21/56]
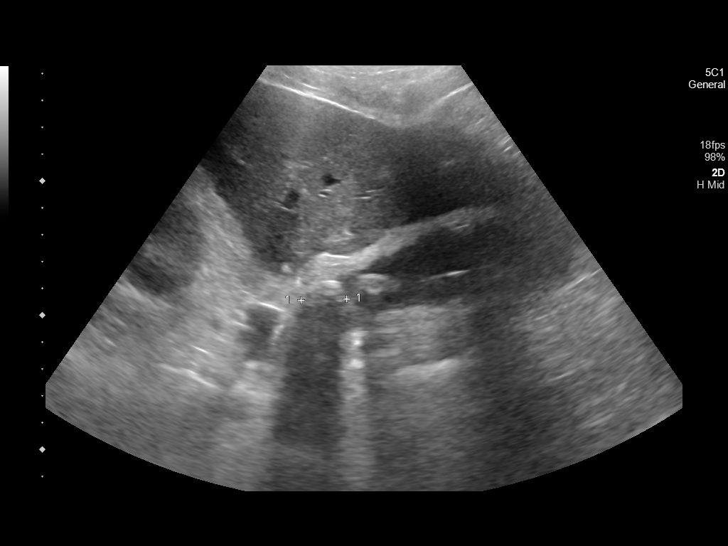
[im 26/56]
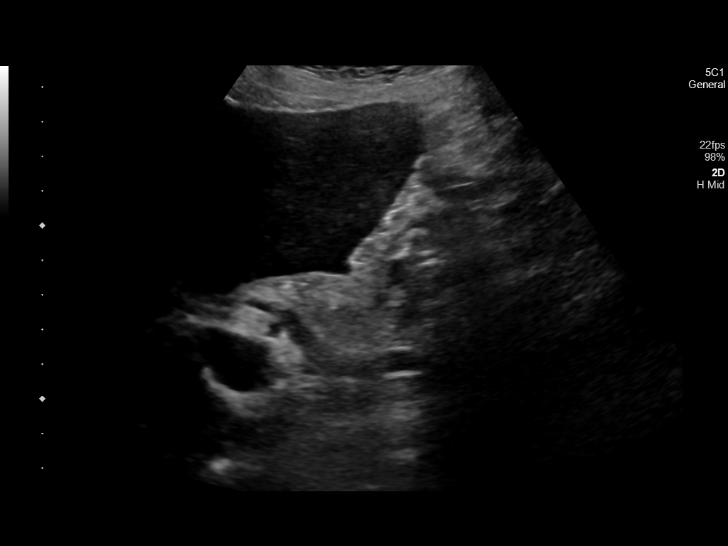
[im 30/56]
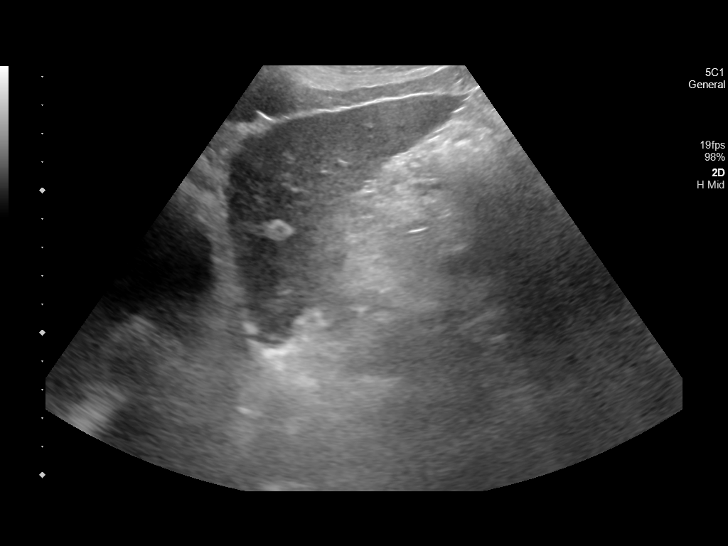
[im 35/56]
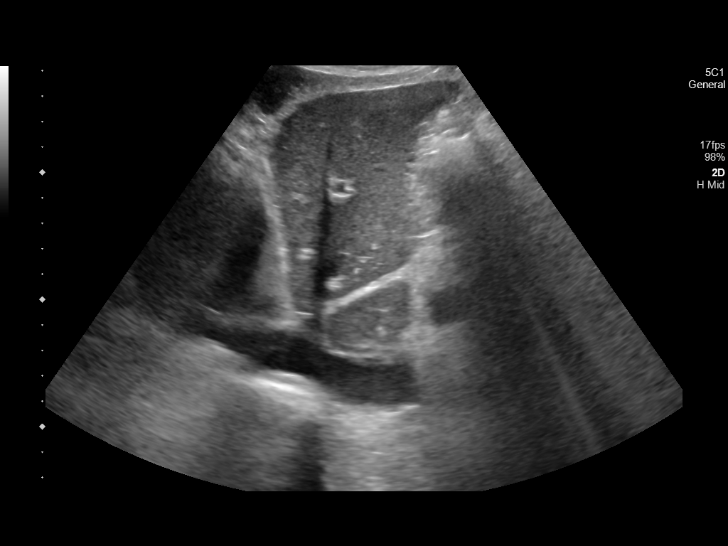
[im 37/56]
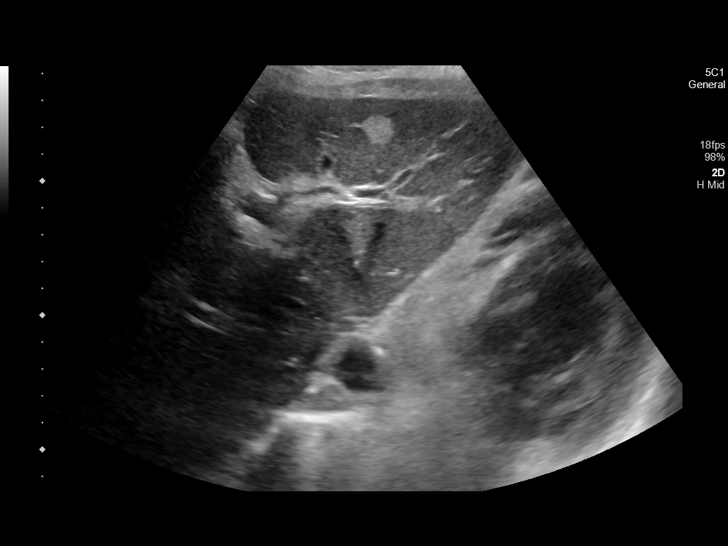
[im 42/56]
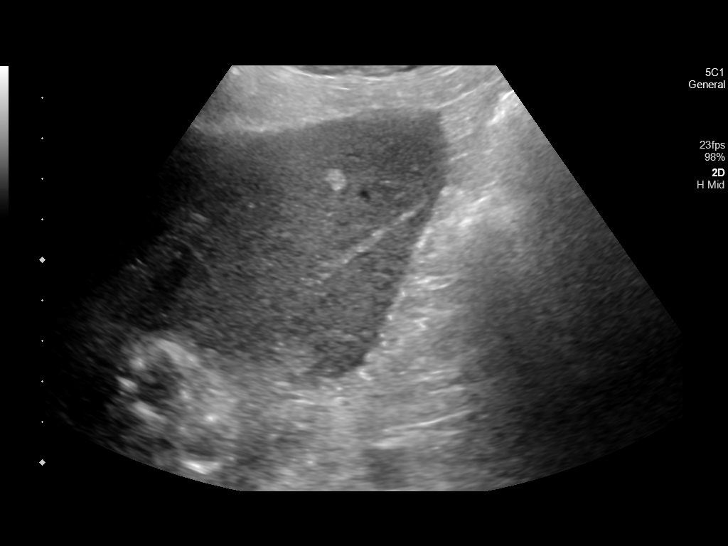
[im 46/56]
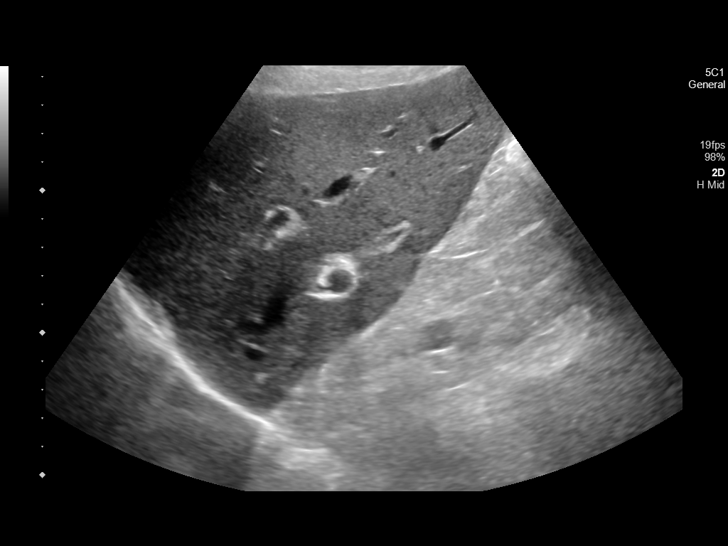
[im 51/56]
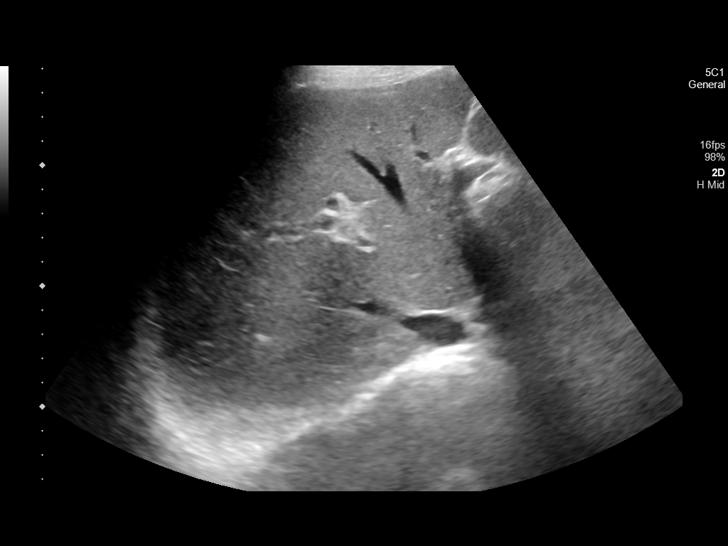
[im 56/56]
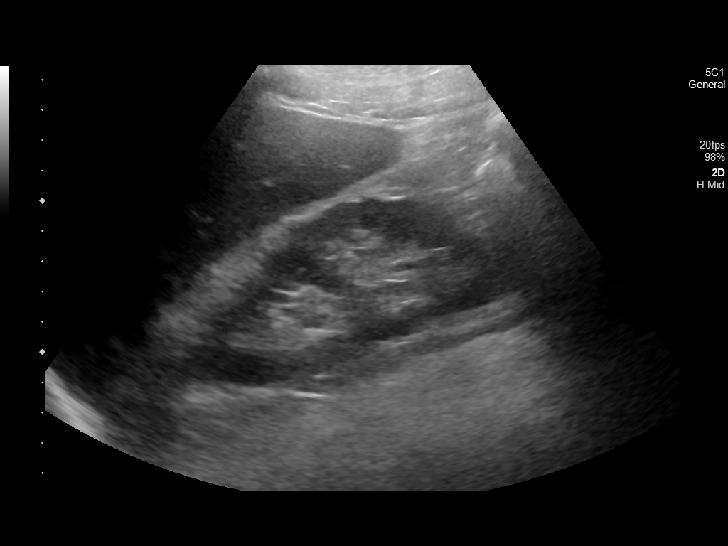

[14 of 25 positions shown; findings below may reference images not displayed]

FINDINGS: Gallbladder:

Numerous gallstones measuring up to 1.7 cm. Sludge also noted within
the gallbladder. Wall is thickened measuring 6 mm.

Common bile duct:

Diameter: Normal caliber, 6 mm.

Liver:

Normal echotexture. Echogenic focus in the left hepatic lobe
measures up to 12 mm. Echogenic focus in the right hepatic lobe
measures 6 mm. Appearance is most compatible with hemangiomas.
Portal vein is patent on color Doppler imaging with normal direction
of blood flow towards the liver.

Other: None.
IMPRESSION: Gallstones and sludge within the gallbladder. Gallbladder wall
thickening. Negative sonographic Murphy sign.

Small hyperechoic lesions within the liver, most compatible with
hemangiomas.

## 2021-10-11 ENCOUNTER — Ambulatory Visit
Admission: EM | Admit: 2021-10-11 | Discharge: 2021-10-11 | Disposition: A | Discharge status: Home / Self Care | Payer: 59 | Attending: Family Medicine | Admitting: Family Medicine

## 2021-10-11 ENCOUNTER — Telehealth: Payer: Self-pay | Admitting: Family Medicine

## 2021-10-11 DIAGNOSIS — U071 COVID-19: Secondary | ICD-10-CM

## 2021-10-11 DIAGNOSIS — Z20822 Contact with and (suspected) exposure to covid-19: Secondary | ICD-10-CM | POA: Diagnosis present

## 2021-10-11 LAB — SARS CORONAVIRUS 2 BY RT PCR: SARS Coronavirus 2 by RT PCR: POSITIVE — AB

## 2021-10-11 MED ORDER — MOLNUPIRAVIR 200 MG PO CAPS: 4.0000 | ORAL_CAPSULE | Freq: Two times a day (BID) | ORAL | 0 refills | Status: DC

## 2021-10-11 MED ORDER — MOLNUPIRAVIR 200 MG PO CAPS: 4.0000 | ORAL_CAPSULE | Freq: Two times a day (BID) | ORAL | 0 refills | Status: AC

## 2021-10-11 MED ORDER — MOLNUPIRAVIR 200 MG PO CAPS
4.0000 | ORAL_CAPSULE | Freq: Two times a day (BID) | ORAL | 0 refills | Status: DC
Start: 2021-10-11 — End: 2021-10-11

## 2021-10-11 NOTE — Discharge Instructions (Addendum)

## 2021-10-11 NOTE — ED Provider Notes (Signed)
[facility]    CSN: [id number] Arrival date & time: 10/11/21  0841      History   Chief Complaint Chief Complaint  Patient presents with   Weakness   Fatigue    HPI Dean Dominguez is a 61 y.o. male.   HPI   Dean Dominguez presents after COVID exposure.  Several of his friends tested positive and he was around them last night.  This morning, he was at work and a Radio broadcast assistant and his boss told him to go get tested.  He is experiencing myalgias, fatigue, scratchy throat and generalized weakness.  He has not had a fever or chills.  There is no cough or headache.  Denies shortness of breath, chest pain or abdominal pain.  He has chronic nasal congestion especially on the left side.  He does note that his nose has been more runny.  He had a couple days of diarrhea but this has resolved.  He endorses nausea but no vomiting.  He has not had an appetite.  He smokes but does not have a history of COPD or asthma.  He works at a cigarette factory.    Past Medical History:  Diagnosis Date   Arthritis    hands & knees   Depression    Dyspnea    DOE (pt denies 02/16/18)   Hx MRSA infection    Hypertension    Tobacco dependence     Patient Active Problem List   Diagnosis Date Noted   Left-sided Bell's palsy 03/23/2020   Cholecystitis, acute 01/19/2020   Essential hypertension 08/24/2019   Nontraumatic tear of right rotator cuff 03/03/2018   Hyperlipidemia 02/08/2014   Anxiety 09/16/2013   Depression 09/16/2013   Seasonal allergies 09/16/2013    Past Surgical History:  Procedure Laterality Date   ANTERIOR CRUCIATE LIGAMENT REPAIR Right 1999   CATARACT EXTRACTION W/PHACO Left 04/29/2017   Procedure: CATARACT EXTRACTION PHACO AND INTRAOCULAR LENS PLACEMENT (IOC);  Surgeon: Galen Manila, MD;  Location: [facility];  Service: Ophthalmology;  Laterality: Left;  [place] 00:34 AP% 17.6 CDE 6.09 Fluid pak lot # [id number]   CATARACT EXTRACTION W/PHACO Right 05/19/2017   Procedure:  CATARACT EXTRACTION PHACO AND INTRAOCULAR LENS PLACEMENT (IOC);  Surgeon: Galen Manila, MD;  Location: [facility];  Service: Ophthalmology;  Laterality: Right;  [place] 00:30 AP% 14.6 CDE 4.51 Fluid pack lot # [id number]   IR EXCHANGE BILIARY DRAIN  01/26/2020   METAL PLATES  left foot Left 2018   SHOULDER ARTHROSCOPY Left 02/18/2018   Procedure: ARTHROSCOPY SHOULDER WITH EXTENSIVE DEBRIDEMENT, ROTATOR CUFF REPAIR AND SUBACROMIAL DECOMPRESSION;  Surgeon: Signa Kell, MD;  Location: [facility];  Service: Orthopedics;  Laterality: Left;  ARTHREX SWIVELOCK 4.75 MM  OTHER ARTHROCARE WAND BARROL BURR OPEN SHID KOLBALL HOMANS COBBS   TENDON REPAIR Left 01/09/2017   Procedure: TENDON REPAIR-TIBIALIS TENDON;  Surgeon: Gwyneth Revels, DPM;  Location: [facility];  Service: Podiatry;  Laterality: Left;   TONSILLECTOMY     WISDOM TOOTH EXTRACTION  1978   2 wisdom teeth extracted       Home Medications    Prior to Admission medications   Medication Sig Start Date End Date Taking? Authorizing Provider  fluticasone (FLONASE) 50 MCG/ACT nasal spray Place 1 spray into both nostrils daily.   Yes [provider]  hydrOXYzine (VISTARIL) 50 MG capsule Take 1 capsule (50 mg total) by mouth 3 (three) times daily as needed. 08/22/21  Yes Varney Daily, PA  ibuprofen (ADVIL) 800 MG  tablet Take 1 tablet (800 mg total) by mouth every 8 (eight) hours as needed. 03/09/20  Yes [name], MD  molnupiravir EUA (LAGEVRIO) 200 MG CAPS capsule Take 4 capsules (800 mg total) by mouth 2 (two) times daily for 5 days. 10/11/21 10/16/21 Yes Arshawn Valdez, DO  losartan (COZAAR) 25 MG tablet Take 25 mg by mouth at bedtime as needed. 10/17/19   [provider]  naproxen (NAPROSYN) 500 MG tablet Take 500 mg by mouth 2 (two) times daily. 01/09/20   [provider]  venlafaxine XR (EFFEXOR-XR) 75 MG 24 hr capsule Take 75 mg by mouth at bedtime. 12/04/16   [provider]    Family  History Family History  Problem Relation Age of Onset   Hypertension Mother    Healthy Father     Social History Social History   Tobacco Use   Smoking status: Every Day    Packs/day: 1.00    Years: 40.00    Total pack years: 40.00    Types: Cigarettes   Smokeless tobacco: Former    Quit date: 02/03/1997  Vaping Use   Vaping Use: Never used  Substance Use Topics   Alcohol use: Not Currently    Alcohol/week: 6.0 standard drinks of alcohol    Types: 6 Cans of beer per week   Drug use: Yes    Frequency: 2.0 times per week    Types: Marijuana    Comment: used 10 days ago     Allergies   Patient has no known allergies.   Review of Systems Review of Systems: negative unless otherwise stated in HPI.      Physical Exam Triage Vital Signs ED Triage Vitals  Enc Vitals Group     BP 10/11/21 0849 (!) 140/62     Pulse Rate 10/11/21 0849 69     Resp --      Temp 10/11/21 0849 98 F (36.7 C)     Temp Source 10/11/21 0849 Oral     SpO2 10/11/21 0849 100 %     Weight 10/11/21 0848 165 lb (74.8 kg)     Height 10/11/21 0848 5\' 9"  (1.753 m)     Head Circumference --      Peak Flow --      Pain Score 10/11/21 0848 2     Pain Loc --      Pain Edu? --      Excl. in GC? --    No data found.  Updated Vital Signs BP (!) 140/62 (BP Location: Left Arm)   Pulse 69   Temp 98 F (36.7 C) (Oral)   Ht 5\' 9"  (1.753 m)   Wt 74.8 kg   SpO2 100%   BMI 24.37 kg/m   Visual Acuity Right Eye Distance:   Left Eye Distance:   Bilateral Distance:    Right Eye Near:   Left Eye Near:    Bilateral Near:     Physical Exam GEN:     alert, non-toxic appearing male in no distress    HENT:  mucus membranes moist, oropharyngeal without lesions or exudate, large tongue, moderate oropharyngeal erythema , clear nasal discharge EYES:   pupils equal and reactive,no scleral injection NECK:  normal ROM, no lymphadenopathy RESP:  no increased work of breathing, clear to auscultation  bilaterally CVS:   regular rate and rhythm Skin:   warm and dry, no rash on visible skin    UC Treatments / Results  Labs (all labs ordered are  listed, but only abnormal results are displayed) Labs Reviewed  SARS CORONAVIRUS 2 BY RT PCR - Abnormal; Notable for the following components:      Result Value   SARS Coronavirus 2 by RT PCR POSITIVE (*)    All other components within normal limits    EKG   Radiology No results found.  Procedures Procedures (including critical care time)  Medications Ordered in UC Medications - No data to display  Initial Impression / Assessment and Plan / UC Course  I have reviewed the triage vital signs and the nursing notes.  Pertinent labs & imaging results that were available during my care of the patient were reviewed by me and considered in my medical decision making (see chart for details).     COVID exposure Patient is a 61 y.o. male who presents after COVID exposure and not having myalgia and fatigue with scratchy throat and rhinorrhea.  Overall he is well-appearing, well-hydrated and in no respiratory distress.  Discussed symptomatic treatment.  Pulmonary exam exhibits equal aeration bilaterally, imaging deferred.  COVID testing obtained. If he is positive his is interested in COVID treatment.  Pt called and updated on positive COVID status.  After shared decision making, he is interested in [place].  Rx sent to pharmacy.  Work note provided. Quarantine instructions provided.  ED and return precautions and understanding voiced. Discussed MDM, treatment plan and plan for follow-up with patient/parent who agrees with plan.    Final Clinical Impressions(s) / UC Diagnoses   Final diagnoses:  Close exposure to COVID-19 virus  COVID-19     Discharge Instructions      We will contact you if your COVID test is positive.  Please quarantine while you wait for the results.  If your test is negative you may resume normal activities.   If your test is positive please continue to quarantine for at least 5 days from your symptom onset or until you are without a fever for at least 24 hours after the medications.   You can take Tylenol and/or Ibuprofen as needed for fever reduction and pain relief.    For cough: honey 1/2 to 1 teaspoon (you can dilute the honey in water or another fluid).  You can also use guaifenesin and dextromethorphan for cough. You can use a humidifier for chest congestion and cough.  If you don't have a humidifier, you can sit in the bathroom with the hot shower running.      For sore throat: try warm salt water gargles, cepacol lozenges, throat spray, warm tea or water with lemon/honey, popsicles or ice, or OTC cold relief medicine for throat discomfort.    For congestion: take a daily anti-histamine like Zyrtec, Claritin, and a oral decongestant, such as pseudoephedrine.  You can also use Flonase 1-2 sprays in each nostril daily.    It is important to stay hydrated: drink plenty of fluids (water, gatorade/powerade/pedialyte, juices, or teas) to keep your throat moisturized and help further relieve irritation/discomfort.    Return or go to the Emergency Department if symptoms worsen or do not improve in the next few days      ED Prescriptions     Medication Sig Dispense Auth. Provider   molnupiravir EUA (LAGEVRIO) 200 MG CAPS capsule Take 4 capsules (800 mg total) by mouth 2 (two) times daily for 5 days. 40 capsule Lolly Glaus, DO      PDMP not reviewed this encounter.   Katha Cabal, DO 10/11/21 [phone]

## 2021-10-11 NOTE — Addendum Note (Signed)
Addended by: Katha Cabal D on: 10/11/2021 12:05 PM   Modules accepted: Orders

## 2021-10-11 NOTE — Telephone Encounter (Signed)
Patient seen earlier today and diagnosed with COVID.  He called requesting a printed prescription for molnupiravir.   Katha Cabal, DO 10/11/21

## 2021-10-11 NOTE — ED Triage Notes (Signed)
Pt states x couple of friends have tested positive for covid. C/o legs aching, fatigue, weakness onset this morning

## 2022-05-26 DIAGNOSIS — G8929 Other chronic pain: Secondary | ICD-10-CM | POA: Diagnosis present

## 2022-06-04 ENCOUNTER — Ambulatory Visit
Admission: EM | Admit: 2022-06-04 | Discharge: 2022-06-04 | Disposition: A | Discharge status: Home / Self Care | Payer: 59

## 2022-06-04 ENCOUNTER — Ambulatory Visit (INDEPENDENT_AMBULATORY_CARE_PROVIDER_SITE_OTHER): Payer: 59

## 2022-06-04 DIAGNOSIS — S62661A Nondisplaced fracture of distal phalanx of left index finger, initial encounter for closed fracture: Secondary | ICD-10-CM | POA: Diagnosis not present

## 2022-06-04 DIAGNOSIS — S61211A Laceration without foreign body of left index finger without damage to nail, initial encounter: Secondary | ICD-10-CM

## 2022-06-04 DIAGNOSIS — Z23 Encounter for immunization: Secondary | ICD-10-CM

## 2022-06-04 MED ORDER — TETANUS-DIPHTH-ACELL PERTUSSIS 5-2.5-18.5 LF-MCG/0.5 IM SUSY
0.5000 mL | PREFILLED_SYRINGE | Freq: Once | INTRAMUSCULAR | Status: AC
  Administered 2022-06-04: 0.5 mL via INTRAMUSCULAR

## 2022-06-04 MED ORDER — AMOXICILLIN-POT CLAVULANATE 875-125 MG PO TABS: 1.0000 | ORAL_TABLET | Freq: Two times a day (BID) | ORAL | 0 refills | Status: AC

## 2022-06-04 MED ORDER — MUPIROCIN 2 % EX OINT: 1.0000 | TOPICAL_OINTMENT | Freq: Two times a day (BID) | CUTANEOUS | 0 refills | Status: DC

## 2022-06-04 NOTE — ED Triage Notes (Signed)
Pt does not know when his last tetanus shot was.

## 2022-06-04 NOTE — ED Provider Notes (Addendum)
[facility]    CSN: [id number] Arrival date & time: 06/04/22  0807      History   Chief Complaint Chief Complaint  Patient presents with   Finger Injury    HPI Dean Dominguez is a 62 y.o. male presenting for wound of the left index finger as well as pain and swelling x 4 days.  Patient says that he was hammering a piece of metal and hit his finger with a hammer.  He says he initially cleaned it but has not really been cleaning it since.  It is contaminated and dirty.  He denies foreign body.  He says it did bleed for the first couple days and then after that had clear drainage from the wound.  He reports that the tip of the finger is little bit numb but he can still move it.  Denies any severe pain.  Denies any pustular drainage or fever.  Unsure of last tetanus immunization.  No other complaints.  HPI  Past Medical History:  Diagnosis Date   Arthritis    hands & knees   Depression    Dyspnea    DOE (pt denies 02/16/18)   Hx MRSA infection    Hypertension    Tobacco dependence     Patient Active Problem List   Diagnosis Date Noted   Left-sided Bell's palsy 03/23/2020   Cholecystitis, acute 01/19/2020   Essential hypertension 08/24/2019   Nontraumatic tear of right rotator cuff 03/03/2018   Hyperlipidemia 02/08/2014   Anxiety 09/16/2013   Depression 09/16/2013   Seasonal allergies 09/16/2013    Past Surgical History:  Procedure Laterality Date   ANTERIOR CRUCIATE LIGAMENT REPAIR Right 1999   CATARACT EXTRACTION W/PHACO Left 04/29/2017   Procedure: CATARACT EXTRACTION PHACO AND INTRAOCULAR LENS PLACEMENT (IOC);  Surgeon: Galen Manila, MD;  Location: [facility];  Service: Ophthalmology;  Laterality: Left;  [place] 00:34 AP% 17.6 CDE 6.09 Fluid pak lot # [id number]   CATARACT EXTRACTION W/PHACO Right 05/19/2017   Procedure: CATARACT EXTRACTION PHACO AND INTRAOCULAR LENS PLACEMENT (IOC);  Surgeon: Galen Manila, MD;  Location: [facility];  Service:  Ophthalmology;  Laterality: Right;  [place] 00:30 AP% 14.6 CDE 4.51 Fluid pack lot # [id number]   IR EXCHANGE BILIARY DRAIN  01/26/2020   METAL PLATES  left foot Left 2018   SHOULDER ARTHROSCOPY Left 02/18/2018   Procedure: ARTHROSCOPY SHOULDER WITH EXTENSIVE DEBRIDEMENT, ROTATOR CUFF REPAIR AND SUBACROMIAL DECOMPRESSION;  Surgeon: Signa Kell, MD;  Location: [facility];  Service: Orthopedics;  Laterality: Left;  ARTHREX SWIVELOCK 4.75 MM  OTHER ARTHROCARE WAND BARROL BURR OPEN SHID KOLBALL HOMANS COBBS   TENDON REPAIR Left 01/09/2017   Procedure: TENDON REPAIR-TIBIALIS TENDON;  Surgeon: Gwyneth Revels, DPM;  Location: [facility];  Service: Podiatry;  Laterality: Left;   TONSILLECTOMY     WISDOM TOOTH EXTRACTION  1978   2 wisdom teeth extracted       Home Medications    Prior to Admission medications   Medication Sig Start Date End Date Taking? Authorizing Provider  amoxicillin-clavulanate (AUGMENTIN) 875-125 MG tablet Take 1 tablet by mouth every 12 (twelve) hours for 7 days. 06/04/22 06/11/22 Yes Shirlee Latch, PA-C  FLUoxetine (PROZAC) 40 MG capsule Take by mouth. 05/26/22  Yes [provider]  fluticasone (FLONASE) 50 MCG/ACT nasal spray Place 1 spray into both nostrils daily.   Yes [provider]  losartan (COZAAR) 25 MG tablet Take 25 mg by mouth at bedtime as needed. 10/17/19  Yes [provider]  mupirocin ointment (BACTROBAN) 2 % Apply 1 Application topically 2 (two) times daily. 06/04/22  Yes Eusebio Friendly B, PA-C  naproxen (NAPROSYN) 500 MG tablet Take 500 mg by mouth 2 (two) times daily. 01/09/20  Yes [provider]  hydrOXYzine (VISTARIL) 50 MG capsule Take 1 capsule (50 mg total) by mouth 3 (three) times daily as needed. 08/22/21   Varney Daily, PA  ibuprofen (ADVIL) 800 MG tablet Take 1 tablet (800 mg total) by mouth every 8 (eight) hours as needed. 03/09/20   [name], MD  venlafaxine XR (EFFEXOR-XR) 75 MG 24 hr capsule Take 75  mg by mouth at bedtime. 12/04/16   [provider]    Family History Family History  Problem Relation Age of Onset   Hypertension Mother    Healthy Father     Social History Social History   Tobacco Use   Smoking status: Every Day    Packs/day: 1.00    Years: 40.00    Additional pack years: 0.00    Total pack years: 40.00    Types: Cigarettes   Smokeless tobacco: Former    Quit date: 02/03/1997  Vaping Use   Vaping Use: Never used  Substance Use Topics   Alcohol use: Yes    Alcohol/week: 6.0 standard drinks of alcohol    Types: 6 Cans of beer per week   Drug use: Yes    Frequency: 2.0 times per week    Types: Marijuana    Comment: used 10 days ago     Allergies   Patient has no known allergies.   Review of Systems Review of Systems  Musculoskeletal:  Positive for arthralgias and joint swelling.  Skin:  Positive for wound. Negative for color change.  Neurological:  Negative for weakness and numbness.     Physical Exam Triage Vital Signs ED Triage Vitals  Enc Vitals Group     BP      Pulse      Resp      Temp      Temp src      SpO2      Weight      Height      Head Circumference      Peak Flow      Pain Score      Pain Loc      Pain Edu?      Excl. in GC?    No data found.  Updated Vital Signs BP (!) 144/70 (BP Location: Left Arm)   Pulse 76   Temp 98.5 F (36.9 C) (Oral)   Ht 5\' 9"  (1.753 m)   Wt 165 lb (74.8 kg)   SpO2 96%   BMI 24.37 kg/m   Physical Exam Vitals and nursing note reviewed.  Constitutional:      General: He is not in acute distress.    Appearance: Normal appearance. He is well-developed.  HENT:     Head: Normocephalic and atraumatic.  Eyes:     General: No scleral icterus.    Conjunctiva/sclera: Conjunctivae normal.  Cardiovascular:     Rate and Rhythm: Normal rate.     Pulses: Normal pulses.  Pulmonary:     Effort: Pulmonary effort is normal. No respiratory distress.  Musculoskeletal:     Cervical  back: Neck supple.  Skin:    General: Skin is warm and dry.     Capillary Refill: Capillary refill takes less than 2 seconds.     Comments: Left  index finger: Moderate swelling of the distal finger.  There is a laceration that appears fairly superficial of the distal index finger.  It is highly contaminated.  Tissue is little macerated.  No bleeding or pustular drainage.  Slightly decreased range of motion at the DIP joint, otherwise normal range of motion.  Normal pulses and cap refill.  Neurological:     General: No focal deficit present.     Mental Status: He is alert. Mental status is at baseline.     Motor: No weakness.     Gait: Gait normal.  Psychiatric:        Mood and Affect: Mood normal.        Behavior: Behavior normal.      UC Treatments / Results  Labs (all labs ordered are listed, but only abnormal results are displayed) Labs Reviewed - No data to display  EKG   Radiology DG Finger Index Left  Result Date: 06/04/2022 CLINICAL DATA:  Injured while bending a piece of metal. Bleeding and drainage. EXAM: LEFT INDEX FINGER 3V COMPARISON:  None Available. FINDINGS: Extensive soft tissue swelling are present about the distal aspect of the index finger. Bone fragment suggests a tiny avulsion fracture along the ulnar aspect of the distal phalanx proximally. No additional fractures are present. No radiopaque foreign body is present. IMPRESSION: Extensive soft tissue swelling about the distal aspect of the index finger with probable tiny avulsion fracture along the ulnar aspect of the distal phalanx. Electronically Signed   By: Marin Roberts M.D.   On: 06/04/2022 08:58    Procedures Procedures (including critical care time)  Medications Ordered in UC Medications  Tdap (BOOSTRIX) injection 0.5 mL (0.5 mLs Intramuscular Given 06/04/22 0913)    Initial Impression / Assessment and Plan / UC Course  I have reviewed the triage vital signs and the nursing notes.  Pertinent  labs & imaging results that were available during my care of the patient were reviewed by me and considered in my medical decision making (see chart for details).   62 year old male presents for pain, swelling and laceration of the distal left index finger x 4 days.  He injured it by hitting it with a hammer accidentally.  I have included an image of the patient's finger in chart.  It does show what appears to be fairly superficial laceration of the distal finger.  It is diffusely tender to palpation at the distal finger.  Wound is highly contaminated.  Wound cleansed with chlorhexidine.  X-ray obtained today shows no definite fractures but probable tiny avulsion fracture along the ulnar aspect of the distal phalanx.  He does have an area of laceration over the possible fracture.  After cleansing the wound, bandaged it.  Sent Augmentin to try to prevent infection.  Tetanus immunization updated.  Discussed wound care guidelines with patient.  He does follow-up with [facility] for an knee condition.  States he had an appointment with them yesterday but canceled it.  Advised him to contact [place] clinic back today as soon as possible to make an appointment.  We discussed that if he does have an open fracture it is imperative that he sees orthopedics.  Patient is understanding and agreeable to plan.  ED precautions discussed.   Final Clinical Impressions(s) / UC Diagnoses   Final diagnoses:  Closed nondisplaced fracture of distal phalanx of left index finger, initial encounter  Laceration of left index finger without foreign body without damage to nail, initial encounter  Discharge Instructions      -The x-ray shows a possible fracture of the distal finger.  Since you have a wound in this area I am concerned that you will need to follow-up with orthopedics.  Since you are already established with orthopedics at [facility], call them today for an appointment. - It is important that you  clean your wound at least once a day with soap and water.  Change the bandage regularly.  Do not use affected finger until orthopedics clears you. -You can take Tylenol for pain. - If at any point you develop worsening pain, fever, increased swelling, redness or drainage that she go to the ER.  I would have sent antibiotic for discharge to prevent infection but if you have any signs of infections imperative that you go to ER immediately if you are still waiting to see orthopedics.     ED Prescriptions     Medication Sig Dispense Auth. Provider   mupirocin ointment (BACTROBAN) 2 % Apply 1 Application topically 2 (two) times daily. 22 g Eusebio Friendly B, PA-C   amoxicillin-clavulanate (AUGMENTIN) 875-125 MG tablet Take 1 tablet by mouth every 12 (twelve) hours for 7 days. 14 tablet Gareth Morgan      PDMP not reviewed this encounter.   Shirlee Latch, PA-C 06/04/22 0920    Shirlee Latch, PA-C 06/04/22 [phone]

## 2022-06-04 NOTE — ED Triage Notes (Signed)
Pt c/o injury to left 2nd finger x4days  Pt states that he was bending a piece of metal and wasn't paying attention before hitting his finger.   Pt has an injury on the lateral side of the 2nd finger from finger tip to the 2nd joint. Pt states that it has drained "white liquidy stuffy" for 2 days after bleeding through Sunday and Monday.  Pt can not bend his finger and states that it is a "little sore"

## 2022-06-04 NOTE — Discharge Instructions (Addendum)
-  The x-ray shows a possible fracture of the distal finger.  Since you have a wound in this area I am concerned that you will need to follow-up with orthopedics.  Since you are already established with orthopedics at [facility], call them today for an appointment. - It is important that you clean your wound at least once a day with soap and water.  Change the bandage regularly.  Do not use affected finger until orthopedics clears you. -You can take Tylenol for pain. - If at any point you develop worsening pain, fever, increased swelling, redness or drainage that she go to the ER.  I would have sent antibiotic for discharge to prevent infection but if you have any signs of infections imperative that you go to ER immediately if you are still waiting to see orthopedics.

## 2022-07-23 ENCOUNTER — Observation Stay
Admission: EM | Admit: 2022-07-23 | Discharge: 2022-07-24 | Disposition: A | Discharge status: Home / Self Care | Payer: 59 | Attending: Internal Medicine | Admitting: Internal Medicine

## 2022-07-23 ENCOUNTER — Other Ambulatory Visit: Payer: Self-pay

## 2022-07-23 DIAGNOSIS — D62 Acute posthemorrhagic anemia: Secondary | ICD-10-CM

## 2022-07-23 DIAGNOSIS — K269 Duodenal ulcer, unspecified as acute or chronic, without hemorrhage or perforation: Secondary | ICD-10-CM | POA: Diagnosis not present

## 2022-07-23 DIAGNOSIS — K922 Gastrointestinal hemorrhage, unspecified: Secondary | ICD-10-CM | POA: Diagnosis not present

## 2022-07-23 DIAGNOSIS — F419 Anxiety disorder, unspecified: Secondary | ICD-10-CM | POA: Diagnosis present

## 2022-07-23 DIAGNOSIS — Z79899 Other long term (current) drug therapy: Secondary | ICD-10-CM | POA: Insufficient documentation

## 2022-07-23 DIAGNOSIS — K921 Melena: Secondary | ICD-10-CM | POA: Diagnosis not present

## 2022-07-23 DIAGNOSIS — K449 Diaphragmatic hernia without obstruction or gangrene: Secondary | ICD-10-CM | POA: Insufficient documentation

## 2022-07-23 DIAGNOSIS — F17210 Nicotine dependence, cigarettes, uncomplicated: Secondary | ICD-10-CM | POA: Insufficient documentation

## 2022-07-23 DIAGNOSIS — Z791 Long term (current) use of non-steroidal anti-inflammatories (NSAID): Secondary | ICD-10-CM

## 2022-07-23 DIAGNOSIS — K920 Hematemesis: Principal | ICD-10-CM | POA: Insufficient documentation

## 2022-07-23 DIAGNOSIS — I10 Essential (primary) hypertension: Secondary | ICD-10-CM | POA: Insufficient documentation

## 2022-07-23 DIAGNOSIS — K2289 Other specified disease of esophagus: Secondary | ICD-10-CM | POA: Diagnosis not present

## 2022-07-23 DIAGNOSIS — I959 Hypotension, unspecified: Secondary | ICD-10-CM

## 2022-07-23 DIAGNOSIS — Z8711 Personal history of peptic ulcer disease: Secondary | ICD-10-CM

## 2022-07-23 DIAGNOSIS — G8929 Other chronic pain: Secondary | ICD-10-CM | POA: Diagnosis present

## 2022-07-23 DIAGNOSIS — F17200 Nicotine dependence, unspecified, uncomplicated: Secondary | ICD-10-CM | POA: Diagnosis present

## 2022-07-23 DIAGNOSIS — F1020 Alcohol dependence, uncomplicated: Secondary | ICD-10-CM

## 2022-07-23 LAB — CBC WITH DIFFERENTIAL/PLATELET
Abs Immature Granulocytes: 0.05 10*3/uL (ref 0.00–0.07)
Basophils Absolute: 0.1 10*3/uL (ref 0.0–0.1)
Basophils Relative: 1 %
Eosinophils Absolute: 0.4 10*3/uL (ref 0.0–0.5)
Eosinophils Relative: 4 %
HCT: 35.2 % — ABNORMAL LOW (ref 39.0–52.0)
Hemoglobin: 11.3 g/dL — ABNORMAL LOW (ref 13.0–17.0)
Immature Granulocytes: 1 %
Lymphocytes Relative: 17 %
Lymphs Abs: 1.4 10*3/uL (ref 0.7–4.0)
MCH: 33.9 pg (ref 26.0–34.0)
MCHC: 32.1 g/dL (ref 30.0–36.0)
MCV: 105.7 fL — ABNORMAL HIGH (ref 80.0–100.0)
Monocytes Absolute: 0.6 10*3/uL (ref 0.1–1.0)
Monocytes Relative: 8 %
Neutro Abs: 5.4 10*3/uL (ref 1.7–7.7)
Neutrophils Relative %: 69 %
Platelets: 356 10*3/uL (ref 150–400)
RBC: 3.33 MIL/uL — ABNORMAL LOW (ref 4.22–5.81)
RDW: 12.5 % (ref 11.5–15.5)
WBC: 7.9 10*3/uL (ref 4.0–10.5)
nRBC: 0 % (ref 0.0–0.2)

## 2022-07-23 LAB — COMPREHENSIVE METABOLIC PANEL
ALT: 7 U/L (ref 0–44)
AST: 14 U/L — ABNORMAL LOW (ref 15–41)
Albumin: 2.8 g/dL — ABNORMAL LOW (ref 3.5–5.0)
Alkaline Phosphatase: 61 U/L (ref 38–126)
Anion gap: 8 (ref 5–15)
BUN: 31 mg/dL — ABNORMAL HIGH (ref 8–23)
CO2: 24 mmol/L (ref 22–32)
Calcium: 7.8 mg/dL — ABNORMAL LOW (ref 8.9–10.3)
Chloride: 106 mmol/L (ref 98–111)
Creatinine, Ser: 0.99 mg/dL (ref 0.61–1.24)
GFR, Estimated: >60 mL/min (ref >60)
Glucose, Bld: 159 mg/dL — ABNORMAL HIGH (ref 70–99)
Potassium: 4.6 mmol/L (ref 3.5–5.1)
Sodium: 138 mmol/L (ref 135–145)
Total Bilirubin: 0.6 mg/dL (ref 0.3–1.2)
Total Protein: 5.4 g/dL — ABNORMAL LOW (ref 6.5–8.1)

## 2022-07-23 LAB — ETHANOL: Alcohol, Ethyl (B): <10 mg/dL (ref <10)

## 2022-07-23 LAB — LIPASE, BLOOD: Lipase: 30 U/L (ref 11–51)

## 2022-07-23 LAB — TYPE AND SCREEN
ABO/RH(D): A POS
Antibody Screen: NEGATIVE

## 2022-07-23 LAB — LACTIC ACID, PLASMA: Lactic Acid, Venous: 1.7 mmol/L (ref 0.5–1.9)

## 2022-07-23 MED ORDER — THIAMINE MONONITRATE 100 MG PO TABS: 100.0000 mg | ORAL_TABLET | Freq: Every day | ORAL | Status: DC

## 2022-07-23 MED ORDER — ACETAMINOPHEN 650 MG RE SUPP: 650.0000 mg | Freq: Four times a day (QID) | RECTAL | Status: DC | PRN

## 2022-07-23 MED ORDER — PANTOPRAZOLE SODIUM 40 MG IV SOLR
40.0000 mg | Freq: Once | INTRAVENOUS | Status: AC
  Administered 2022-07-24: 40 mg via INTRAVENOUS
  Filled 2022-07-23: qty 10

## 2022-07-23 MED ORDER — LORAZEPAM 1 MG PO TABS: 1.0000 mg | ORAL_TABLET | ORAL | Status: DC | PRN

## 2022-07-23 MED ORDER — ONDANSETRON HCL 4 MG/2ML IJ SOLN: 4.0000 mg | Freq: Four times a day (QID) | INTRAMUSCULAR | Status: DC | PRN

## 2022-07-23 MED ORDER — THIAMINE HCL 100 MG/ML IJ SOLN: 100.0000 mg | Freq: Every day | INTRAMUSCULAR | Status: DC

## 2022-07-23 MED ORDER — PANTOPRAZOLE SODIUM 40 MG IV SOLR
40.0000 mg | Freq: Once | INTRAVENOUS | Status: AC
  Administered 2022-07-23: 40 mg via INTRAVENOUS
  Filled 2022-07-23: qty 10

## 2022-07-23 MED ORDER — NICOTINE 21 MG/24HR TD PT24
21.0000 mg | MEDICATED_PATCH | Freq: Every day | TRANSDERMAL | Status: DC
  Administered 2022-07-24: 21 mg via TRANSDERMAL
  Filled 2022-07-23: qty 1

## 2022-07-23 MED ORDER — ONDANSETRON HCL 4 MG/2ML IJ SOLN
4.0000 mg | Freq: Once | INTRAMUSCULAR | Status: AC | PRN
  Administered 2022-07-23: 4 mg via INTRAVENOUS
  Filled 2022-07-23: qty 2

## 2022-07-23 MED ORDER — PANTOPRAZOLE 80MG IVPB - SIMPLE MED: 80.0000 mg | Freq: Once | INTRAVENOUS | Status: DC

## 2022-07-23 MED ORDER — SODIUM CHLORIDE 0.9 % IV BOLUS
1000.0000 mL | Freq: Once | INTRAVENOUS | Status: AC
  Administered 2022-07-23: 1000 mL via INTRAVENOUS

## 2022-07-23 MED ORDER — ONDANSETRON HCL 4 MG PO TABS: 4.0000 mg | ORAL_TABLET | Freq: Four times a day (QID) | ORAL | Status: DC | PRN

## 2022-07-23 MED ORDER — ADULT MULTIVITAMIN W/MINERALS CH: 1.0000 | ORAL_TABLET | Freq: Every day | ORAL | Status: DC

## 2022-07-23 MED ORDER — ACETAMINOPHEN 325 MG PO TABS: 650.0000 mg | ORAL_TABLET | Freq: Four times a day (QID) | ORAL | Status: DC | PRN

## 2022-07-23 MED ORDER — PANTOPRAZOLE SODIUM 40 MG IV SOLR: 40.0000 mg | Freq: Two times a day (BID) | INTRAVENOUS | Status: DC

## 2022-07-23 MED ORDER — PANTOPRAZOLE INFUSION (NEW) - SIMPLE MED
8.0000 mg/h | INTRAVENOUS | Status: DC
  Administered 2022-07-24 (×2): 8 mg/h via INTRAVENOUS
  Filled 2022-07-23 (×2): qty 100

## 2022-07-23 MED ORDER — LORAZEPAM 2 MG/ML IJ SOLN
1.0000 mg | INTRAMUSCULAR | Status: DC | PRN
  Administered 2022-07-24: 2 mg via INTRAVENOUS
  Filled 2022-07-23: qty 1

## 2022-07-23 MED ORDER — FOLIC ACID 1 MG PO TABS: 1.0000 mg | ORAL_TABLET | Freq: Every day | ORAL | Status: DC

## 2022-07-23 MED ORDER — LACTATED RINGERS IV SOLN: INTRAVENOUS | Status: DC

## 2022-07-23 MED ORDER — MORPHINE SULFATE (PF) 2 MG/ML IV SOLN: 2.0000 mg | INTRAVENOUS | Status: DC | PRN

## 2022-07-23 NOTE — Assessment & Plan Note (Signed)
-  Nicotine patch 

## 2022-07-23 NOTE — Assessment & Plan Note (Signed)
History of primary hypertension BP 83/49 with EMS fluid responsive Possibly vasovagal versus hypovolemic Hold home antihypertensives Monitor BP

## 2022-07-23 NOTE — ED Notes (Signed)
Did not collect 2nd lactic acid per verbal order by Tempie Hoist.D.

## 2022-07-23 NOTE — Assessment & Plan Note (Addendum)
Chronic pain bilateral knees Non-NSAID pain control to include acetaminophen and multimodal pain management including gabapentin, low-dose narcotic, topical diclofenac, lidocaine patch as well as non-pharmacologic therapies

## 2022-07-23 NOTE — Assessment & Plan Note (Signed)
Hematemesis and melena History of peptic ulcer Daily NSAID and alcohol use Patient presents with 2 days of melena and 2 episodes of hematemesis on the day of arrival, hypotensive to 83/49 with EMS No further episodes since arrival and BP was fluid responsive Serial H&H and transfuse if greater than 2 mg/dL drop in 4 hours or hemoglobin under 7 Additional Protonix bolus for total of 80 mg IV and continue with Protonix infusion N.p.o. for likely EGD in the a.m. Gastroenterologist, Dr. Norma Fredrickson consulted Counseled on avoidance of NSAID cutting back on alcohol use

## 2022-07-23 NOTE — ED Triage Notes (Signed)
Pt BIB [facility] for hematemesis x's 2, black, tarry stools. Hx stomach ulcers, HTN, daily ETOH, HTN, - thinners. Initial bp 83/49 for EMS. PTA 500 fluids, 4 Zofran

## 2022-07-23 NOTE — ED Provider Notes (Signed)
[facility] Provider Note   Event Date/Time   First MD Initiated Contact with Patient 07/23/22 2109     (approximate) History  Hematemesis  HPI TAEDYN MONTPETIT III is a 62 y.o. male with stated past medical history of anxiety/depression, hypertension, and daily alcohol use who presents complaining of 2 episodes of hematemesis and dark tarry stools for the past 2 days.  Patient denies anticoagulation.  EMS state patient's blood pressure was 83/49 on their arrival that improved to 140/80 after 500 cc of fluids.  Patient only complains of lightheadedness and denies any abdominal pain.  Patient states last alcoholic beverage was 24 hours prior to arrival ROS: Patient currently denies any vision changes, tinnitus, difficulty speaking, facial droop, sore throat, chest pain, shortness of breath, abdominal pain, diarrhea, dysuria, or weakness/numbness/paresthesias in any extremity   Physical Exam  Triage Vital Signs: ED Triage Vitals  Enc Vitals Group     BP 07/23/22 2112 128/86     Pulse Rate 07/23/22 2112 76     Resp 07/23/22 2112 17     Temp 07/23/22 2112 98.5 F (36.9 C)     Temp Source 07/23/22 2112 Oral     SpO2 07/23/22 2112 100 %     Weight 07/23/22 2110 148 lb 2.4 oz (67.2 kg)     Height 07/23/22 2110 5\' 10"  (1.778 m)     Head Circumference --      Peak Flow --      Pain Score 07/23/22 2110 0     Pain Loc --      Pain Edu? --      Excl. in GC? --    Most recent vital signs: Vitals:   07/23/22 2112 07/23/22 2130  BP: 128/86 99/65  Pulse: 76 82  Resp: 17 18  Temp: 98.5 F (36.9 C)   SpO2: 100% 99%   General: Awake, oriented x4. CV:  Good peripheral perfusion.  Resp:  Normal effort.  Abd:  No distention.  Other:  Middle-aged overweight Caucasian male laying in bed in no acute distress ED Results / Procedures / Treatments  Labs (all labs ordered are listed, but only abnormal results are displayed) Labs Reviewed  COMPREHENSIVE METABOLIC PANEL  - Abnormal; Notable for the following components:      Result Value   Glucose, Bld 159 (*)    BUN 31 (*)    Calcium 7.8 (*)    Total Protein 5.4 (*)    Albumin 2.8 (*)    AST 14 (*)    All other components within normal limits  CBC WITH DIFFERENTIAL/PLATELET - Abnormal; Notable for the following components:   RBC 3.33 (*)    Hemoglobin 11.3 (*)    HCT 35.2 (*)    MCV 105.7 (*)    All other components within normal limits  ETHANOL  LIPASE, BLOOD  LACTIC ACID, PLASMA  LACTIC ACID, PLASMA  TYPE AND SCREEN   PROCEDURES: Critical Care performed: Yes, see critical care procedure note(s) .1-3 Lead EKG Interpretation  Performed by: Merwyn Katos, MD Authorized by: Merwyn Katos, MD     Interpretation: normal     ECG rate:  71   ECG rate assessment: normal     Rhythm: sinus rhythm     Ectopy: none     Conduction: normal   CRITICAL CARE Performed by: Merwyn Katos  Total critical care time: 41 minutes  Critical care time was exclusive of separately billable procedures and treating  other patients.  Critical care was necessary to treat or prevent imminent or life-threatening deterioration.  Critical care was time spent personally by me on the following activities: development of treatment plan with patient and/or surrogate as well as nursing, discussions with consultants, evaluation of patient's response to treatment, examination of patient, obtaining history from patient or surrogate, ordering and performing treatments and interventions, ordering and review of laboratory studies, ordering and review of radiographic studies, pulse oximetry and re-evaluation of patient's condition.  MEDICATIONS ORDERED IN ED: Medications  sodium chloride 0.9 % bolus 1,000 mL (0 mLs Intravenous Stopped 07/23/22 2224)  pantoprazole (PROTONIX) injection 40 mg (40 mg Intravenous Given 07/23/22 2138)  ondansetron (ZOFRAN) injection 4 mg (4 mg Intravenous Given 07/23/22 2138)   IMPRESSION / MDM /  ASSESSMENT AND PLAN / ED COURSE  I reviewed the triage vital signs and the nursing notes.                             The patient is on the cardiac monitor to evaluate for evidence of arrhythmia and/or significant heart rate changes. Patient's presentation is most consistent with acute presentation with potential threat to life or bodily function. + mixed coffee ground and bright red hematemesis + black stool per rectum Given history and exam patients presentation most consistent with upper GI bleed possibly secondary to peptic ulcer disease or variceal bleeding. I have low suspicion for aortoenteric fistula, ENT bleeding mimic, [place], Pulmonary bleeding mimic.  Workup: CBC, BMP, LFTs, Lipase, PT/INR, Type and Screen  Interventions: Analgesia and antiemetic medications PRN Protonix 40mg  IVP PRBC transfusion PRN  Findings: Hb: 11  Disposition: Admit for close monitoring.   FINAL CLINICAL IMPRESSION(S) / ED DIAGNOSES   Final diagnoses:  Acute upper GI bleeding  Melena  Hematemesis with nausea   Rx / DC Orders   ED Discharge Orders     None      Note:  This document was prepared using Dragon voice recognition software and may include unintentional dictation errors.   Merwyn Katos, MD 07/23/22 2228

## 2022-07-23 NOTE — H&P (Signed)
History and Physical    Patient: Dean Dominguez ZOX:[id number] DOB: 1960/09/12 DOA: 07/23/2022 DOS: the patient was seen and examined on 07/23/2022 PCP: Luciana Axe, NP  Patient coming from: Home  Chief Complaint:  Chief Complaint  Patient presents with   Hematemesis    HPI: Dean Dominguez is a 62 y.o. male with medical history significant for Daily alcohol use (5-6 beers daily), HTN, anxiety, chronic knee pain with daily NSAID use, remote history of peptic ulcer disease who was brought to the ED with concern for GI bleed.  Patient started having black stool 2 days prior and then on the day of arrival he had 2 episodes of coffee-ground and bright red emesis.  He denies abdominal pain, diarrhea, fever or chills.  On arrival of EMS, patient was on the commode and BP was 83/49, responding to a 500 mL fluid bolus to 128/86 by arrival. ED course and data review: Hemodynamically stable on arrival with BP 128/86 and pulse 76, otherwise normal vitals.  Labs significant for hemoglobin of 11.3, down from 15.4 on 05/26/2022 in Care Everywhere.  MCV 105.7.  Lipase and LFTs unremarkable.  EtOH less than 10. ED EG, personally viewed and interpreted showing sinus at 79 with a right bundle branch block. No imaging done in the ED Patient typed and crossed and given a 1 L NS bolus, Zofran and Protonix 40 mg IV . Hospitalist consulted for admission.   Review of Systems: As mentioned in the history of present illness. All other systems reviewed and are negative.  Past Medical History:  Diagnosis Date   Arthritis    hands & knees   Depression    Dyspnea    DOE (pt denies 02/16/18)   Hx MRSA infection    Hypertension    Tobacco dependence    Past Surgical History:  Procedure Laterality Date   ANTERIOR CRUCIATE LIGAMENT REPAIR Right 1999   CATARACT EXTRACTION W/PHACO Left 04/29/2017   Procedure: CATARACT EXTRACTION PHACO AND INTRAOCULAR LENS PLACEMENT (IOC);  Surgeon: Galen Manila,  MD;  Location: [facility];  Service: Ophthalmology;  Laterality: Left;  [place] 00:34 AP% 17.6 CDE 6.09 Fluid pak lot # [id number]   CATARACT EXTRACTION W/PHACO Right 05/19/2017   Procedure: CATARACT EXTRACTION PHACO AND INTRAOCULAR LENS PLACEMENT (IOC);  Surgeon: Galen Manila, MD;  Location: [facility];  Service: Ophthalmology;  Laterality: Right;  [place] 00:30 AP% 14.6 CDE 4.51 Fluid pack lot # [id number]   IR EXCHANGE BILIARY DRAIN  01/26/2020   METAL PLATES  left foot Left 2018   SHOULDER ARTHROSCOPY Left 02/18/2018   Procedure: ARTHROSCOPY SHOULDER WITH EXTENSIVE DEBRIDEMENT, ROTATOR CUFF REPAIR AND SUBACROMIAL DECOMPRESSION;  Surgeon: Signa Kell, MD;  Location: [facility];  Service: Orthopedics;  Laterality: Left;  ARTHREX SWIVELOCK 4.75 MM  OTHER ARTHROCARE WAND BARROL BURR OPEN SHID [employer]   TENDON REPAIR Left 01/09/2017   Procedure: TENDON REPAIR-TIBIALIS TENDON;  Surgeon: Gwyneth Revels, DPM;  Location: [facility];  Service: Podiatry;  Laterality: Left;   TONSILLECTOMY     WISDOM TOOTH EXTRACTION  1978   2 wisdom teeth extracted   Social History:  reports that he has been smoking cigarettes. He has a 40.00 pack-year smoking history. He quit smokeless tobacco use about 25 years ago. He reports current alcohol use of about 6.0 standard drinks of alcohol per week. He reports current drug use. Frequency: 2.00 times per week. Drug: Marijuana.  No Known Allergies  Family History  Problem Relation Age  of Onset   Hypertension Mother    Healthy Father     Prior to Admission medications   Medication Sig Start Date End Date Taking? Authorizing Provider  FLUoxetine (PROZAC) 40 MG capsule Take 40 mg by mouth daily. 05/26/22  Yes [provider]  fluticasone (FLONASE) 50 MCG/ACT nasal spray Place 1 spray into both nostrils daily.   Yes [provider]  hydrOXYzine (VISTARIL) 50 MG capsule Take 1 capsule (50 mg total) by mouth 3 (three) times daily as needed.  08/22/21  Yes Varney Daily, PA  losartan (COZAAR) 50 MG tablet Take 1 tablet by mouth daily. 05/26/22  Yes [provider]  ibuprofen (ADVIL) 800 MG tablet Take 1 tablet (800 mg total) by mouth every 8 (eight) hours as needed. Patient not taking: Reported on 07/23/2022 03/09/20   [name], MD  mupirocin ointment (BACTROBAN) 2 % Apply 1 Application topically 2 (two) times daily. Patient not taking: Reported on 07/23/2022 06/04/22   Eusebio Friendly B, PA-C  naproxen (NAPROSYN) 500 MG tablet Take 500 mg by mouth 2 (two) times daily. Patient not taking: Reported on 07/23/2022 01/09/20   [provider]    Physical Exam: Vitals:   07/23/22 2110 07/23/22 2112 07/23/22 2130  BP:  128/86 99/65  Pulse:  76 82  Resp:  17 18  Temp:  98.5 F (36.9 C)   TempSrc:  Oral   SpO2:  100% 99%  Weight: 67.2 kg    Height: 5\' 10"  (1.778 m)     Physical Exam  Labs on Admission: I have personally reviewed following labs and imaging studies  CBC: Recent Labs  Lab 07/23/22 2123  WBC 7.9  NEUTROABS 5.4  HGB 11.3*  HCT 35.2*  MCV 105.7*  PLT 356   Basic Metabolic Panel: Recent Labs  Lab 07/23/22 2123  NA 138  K 4.6  CL 106  CO2 24  GLUCOSE 159*  BUN 31*  CREATININE 0.99  CALCIUM 7.8*   GFR: Estimated Creatinine Clearance: 71.6 mL/min (by C-G formula based on SCr of 0.99 mg/dL). Liver Function Tests: Recent Labs  Lab 07/23/22 2123  AST 14*  ALT 7  ALKPHOS 61  BILITOT 0.6  PROT 5.4*  ALBUMIN 2.8*   Recent Labs  Lab 07/23/22 2123  LIPASE 30   No results for input(s): "AMMONIA" in the last 168 hours. Coagulation Profile: No results for input(s): "INR", "PROTIME" in the last 168 hours. Cardiac Enzymes: No results for input(s): "CKTOTAL", "CKMB", "CKMBINDEX", "TROPONINI" in the last 168 hours. BNP (last 3 results) No results for input(s): "PROBNP" in the last 8760 hours. HbA1C: No results for input(s): "HGBA1C" in the last 72 hours. CBG: No results  for input(s): "GLUCAP" in the last 168 hours. Lipid Profile: No results for input(s): "CHOL", "HDL", "LDLCALC", "TRIG", "CHOLHDL", "LDLDIRECT" in the last 72 hours. Thyroid Function Tests: No results for input(s): "TSH", "T4TOTAL", "FREET4", "T3FREE", "THYROIDAB" in the last 72 hours. Anemia Panel: No results for input(s): "VITAMINB12", "FOLATE", "FERRITIN", "TIBC", "IRON", "RETICCTPCT" in the last 72 hours. Urine analysis: No results found for: "COLORURINE", "APPEARANCEUR", "LABSPEC", "PHURINE", "GLUCOSEU", "HGBUR", "BILIRUBINUR", "KETONESUR", "PROTEINUR", "UROBILINOGEN", "NITRITE", "LEUKOCYTESUR"  Radiological Exams on Admission: No results found.   Data Reviewed: Relevant notes from primary care and specialist visits, past discharge summaries as available in EHR, including Care Everywhere. Prior diagnostic testing as pertinent to current admission diagnoses Updated medications and problem lists for reconciliation ED course, including vitals, labs, imaging, treatment and response to treatment Triage notes, nursing  and pharmacy notes and ED provider's notes Notable results as noted in HPI   Assessment and Plan: ABLA (acute blood loss anemia) Hematemesis and melena History of peptic ulcer Daily NSAID and alcohol use Patient presents with 2 days of melena and 2 episodes of hematemesis on the day of arrival, hypotensive to 83/49 with EMS No further episodes since arrival and BP was fluid responsive Serial H&H and transfuse if greater than 2 mg/dL drop in 4 hours or hemoglobin under 7 Additional Protonix bolus for total of 80 mg IV and continue with Protonix infusion N.p.o. for likely EGD in the a.m. Gastroenterologist, Dr. Norma Fredrickson consulted Counseled on avoidance of NSAID cutting back on alcohol use   Hypotension History of primary hypertension BP 83/49 with EMS fluid responsive Possibly vasovagal versus hypovolemic Hold home antihypertensives Monitor BP  NSAID long-term  use Chronic pain bilateral knees Non-NSAID pain control to include acetaminophen and multimodal pain management including gabapentin, low-dose narcotic, topical diclofenac, lidocaine patch as well as non-pharmacologic therapies  Alcohol use disorder, moderate, dependence (HCC) Drinks 5-6 beers daily Counseled on cutting back EtOH level less than 10 EtOH withdrawal protocol  Tobacco use disorder Nicotine patch    DVT prophylaxis: SCD  Consults: GI, Dr. Norma Fredrickson  Advance Care Planning:   Code Status: Prior   Family Communication: none  Disposition Plan: Back to previous home environment  Severity of Illness: The appropriate patient status for this patient is INPATIENT. Inpatient status is judged to be reasonable and necessary in order to provide the required intensity of service to ensure the patient's safety. The patient's presenting symptoms, physical exam findings, and initial radiographic and laboratory data in the context of their chronic comorbidities is felt to place them at high risk for further clinical deterioration. Furthermore, it is not anticipated that the patient will be medically stable for discharge from the hospital within 2 midnights of admission.   * I certify that at the point of admission it is my clinical judgment that the patient will require inpatient hospital care spanning beyond 2 midnights from the point of admission due to high intensity of service, high risk for further deterioration and high frequency of surveillance required.*  Author: Andris Baumann, MD 07/23/2022 11:07 PM  For on call review [email or web address].

## 2022-07-23 NOTE — Assessment & Plan Note (Addendum)
Drinks 5-6 beers daily Counseled on cutting back EtOH level less than 10 EtOH withdrawal protocol

## 2022-07-24 ENCOUNTER — Inpatient Hospital Stay: Payer: 59 | Admitting: General Practice

## 2022-07-24 ENCOUNTER — Encounter
Admission: EM | Disposition: A | Discharge status: Home / Self Care | Payer: Self-pay | Source: Home / Self Care | Attending: Emergency Medicine

## 2022-07-24 ENCOUNTER — Encounter: Payer: Self-pay | Admitting: Internal Medicine

## 2022-07-24 DIAGNOSIS — K920 Hematemesis: Secondary | ICD-10-CM

## 2022-07-24 DIAGNOSIS — K921 Melena: Secondary | ICD-10-CM | POA: Diagnosis not present

## 2022-07-24 DIAGNOSIS — D62 Acute posthemorrhagic anemia: Secondary | ICD-10-CM

## 2022-07-24 DIAGNOSIS — K269 Duodenal ulcer, unspecified as acute or chronic, without hemorrhage or perforation: Secondary | ICD-10-CM

## 2022-07-24 HISTORY — PX: ESOPHAGOGASTRODUODENOSCOPY (EGD) WITH PROPOFOL: SHX5813

## 2022-07-24 HISTORY — PX: BIOPSY: SHX5522

## 2022-07-24 LAB — PROTIME-INR
INR: 1.1 (ref 0.8–1.2)
Prothrombin Time: 14.6 seconds (ref 11.4–15.2)

## 2022-07-24 LAB — HEMOGLOBIN
Hemoglobin: 9.9 g/dL — ABNORMAL LOW (ref 13.0–17.0)
Hemoglobin: 9.5 g/dL — ABNORMAL LOW (ref 13.0–17.0)
Hemoglobin: 9.5 g/dL — ABNORMAL LOW (ref 13.0–17.0)

## 2022-07-24 LAB — LACTIC ACID, PLASMA: Lactic Acid, Venous: 0.6 mmol/L (ref 0.5–1.9)

## 2022-07-24 LAB — HIV ANTIBODY (ROUTINE TESTING W REFLEX): HIV Screen 4th Generation wRfx: NONREACTIVE

## 2022-07-24 SURGERY — ESOPHAGOGASTRODUODENOSCOPY (EGD) WITH PROPOFOL
Anesthesia: General

## 2022-07-24 MED ORDER — PROPOFOL 10 MG/ML IV BOLUS
INTRAVENOUS | Status: DC | PRN
  Administered 2022-07-24: 50 mg via INTRAVENOUS

## 2022-07-24 MED ORDER — PROPOFOL 10 MG/ML IV BOLUS
INTRAVENOUS | Status: AC
  Filled 2022-07-24: qty 20

## 2022-07-24 MED ORDER — SODIUM CHLORIDE 0.9 % IV SOLN: INTRAVENOUS | Status: DC

## 2022-07-24 MED ORDER — PROPOFOL 500 MG/50ML IV EMUL
INTRAVENOUS | Status: DC | PRN
  Administered 2022-07-24: 120 ug/kg/min via INTRAVENOUS

## 2022-07-24 MED ORDER — PANTOPRAZOLE SODIUM 40 MG PO TBEC: 40.0000 mg | DELAYED_RELEASE_TABLET | Freq: Two times a day (BID) | ORAL | 0 refills | Status: AC

## 2022-07-24 MED ORDER — LIDOCAINE HCL (CARDIAC) PF 100 MG/5ML IV SOSY
PREFILLED_SYRINGE | INTRAVENOUS | Status: DC | PRN
  Administered 2022-07-24: 100 mg via INTRAVENOUS

## 2022-07-24 MED ORDER — METOCLOPRAMIDE HCL 5 MG/ML IJ SOLN
10.0000 mg | Freq: Four times a day (QID) | INTRAMUSCULAR | Status: DC
  Administered 2022-07-24: 10 mg via INTRAVENOUS
  Filled 2022-07-24: qty 2

## 2022-07-24 NOTE — H&P (View-Only) (Signed)
  Inpatient Consultation   Patient ID: Dean Dominguez is a 62 y.o. male.  Requesting Provider: Hazel Duncan, MD  Date of Admission: 07/23/2022  Date of Consult: 07/24/22   Reason for Consultation: upper gi bleed   Patient's Chief Complaint:   Chief Complaint  Patient presents with   Hematemesis    62 y/o CM with etoh and alcohol dependence, nsaid use, anxiety and htn whom GI is consulted for possible UGIB.  Patient noted he started having dark stools 2 days ago.  Yesterday he had coffee-ground emesis which evolved into some bright red blood.  Patient has been taking 500 mg of Naprosyn twice a day.  He drinks 5-6 beers per day.  He was initially hypotensive at presentation and improved with IVF Resuscitation. Now hemodynamically stable.  No abdominal pain. No further vomiting this morning. No diarrhea/constipation.  Hemoglobin is macrocytic on presentation at 11.3 MCV 106.  It is now down to 9.5 after IV fluid resuscitation  Naprosyn BID Denies Anti-plt agents, and anticoagulants Denies family history of gastrointestinal disease and malignancy Previous Endoscopies:  none    Past Medical History:  Diagnosis Date   Arthritis    hands & knees   Depression    Dyspnea    DOE (pt denies 02/16/18)   Hx MRSA infection    Hypertension    Tobacco dependence     Past Surgical History:  Procedure Laterality Date   ANTERIOR CRUCIATE LIGAMENT REPAIR Right 1999   CATARACT EXTRACTION W/PHACO Left 04/29/2017   Procedure: CATARACT EXTRACTION PHACO AND INTRAOCULAR LENS PLACEMENT (IOC);  Surgeon: Dominguez, William, MD;  Location: [facility];  Service: Ophthalmology;  Laterality: Left;  US 00:34 AP% 17.6 CDE 6.09 Fluid pak lot # [id number]   CATARACT EXTRACTION W/PHACO Right 05/19/2017   Procedure: CATARACT EXTRACTION PHACO AND INTRAOCULAR LENS PLACEMENT (IOC);  Surgeon: Dominguez, William, MD;  Location: [facility];  Service: Ophthalmology;  Laterality: Right;  US 00:30 AP% 14.6 CDE  4.51 Fluid pack lot # [id number]   IR EXCHANGE BILIARY DRAIN  01/26/2020   METAL PLATES  left foot Left 2018   SHOULDER ARTHROSCOPY Left 02/18/2018   Procedure: ARTHROSCOPY SHOULDER WITH EXTENSIVE DEBRIDEMENT, ROTATOR CUFF REPAIR AND SUBACROMIAL DECOMPRESSION;  Surgeon: Dominguez, Sunny, MD;  Location: [facility];  Service: Orthopedics;  Laterality: Left;  ARTHREX SWIVELOCK 4.75 MM  OTHER ARTHROCARE WAND BARROL BURR OPEN SHID [employer]   TENDON REPAIR Left 01/09/2017   Procedure: TENDON REPAIR-TIBIALIS TENDON;  Surgeon: Dominguez, Dean, DPM;  Location: [facility];  Service: Podiatry;  Laterality: Left;   TONSILLECTOMY     WISDOM TOOTH EXTRACTION  1978   2 wisdom teeth extracted    No Known Allergies  Family History  Problem Relation Age of Onset   Hypertension Mother    Healthy Father     Social History   Tobacco Use   Smoking status: Every Day    Packs/day: 1.00    Years: 40.00    Additional pack years: 0.00    Total pack years: 40.00    Types: Cigarettes   Smokeless tobacco: Former    Quit date: 02/03/1997  Vaping Use   Vaping Use: Never used  Substance Use Topics   Alcohol use: Yes    Alcohol/week: 6.0 standard drinks of alcohol    Types: 6 Cans of beer per week   Drug use: Yes    Frequency: 2.0 times per week    Types: Marijuana    Comment: used   10 days ago     Pertinent GI related history and allergies were reviewed with the patient  Review of Systems  Constitutional:  Negative for activity change, appetite change, chills, diaphoresis, fatigue, fever and unexpected weight change.  HENT:  Negative for trouble swallowing and voice change.   Respiratory:  Negative for shortness of breath and wheezing.   Cardiovascular:  Negative for chest pain, palpitations and leg swelling.  Gastrointestinal:  Positive for nausea and vomiting. Negative for abdominal distention, abdominal pain, anal bleeding, blood in stool, constipation, diarrhea and rectal pain.       +  melena, coffee ground emesis, hematemesis  Musculoskeletal:  Negative for arthralgias and myalgias.  Skin:  Negative for color change and pallor.  Neurological:  Positive for dizziness and light-headedness. Negative for syncope and weakness.  Psychiatric/Behavioral:  Negative for confusion. The patient is not nervous/anxious.   All other systems reviewed and are negative.    Medications Home Medications No current facility-administered medications on file prior to encounter.   Current Outpatient Medications on File Prior to Encounter  Medication Sig Dispense Refill   FLUoxetine (PROZAC) 40 MG capsule Take 40 mg by mouth daily.     fluticasone (FLONASE) 50 MCG/ACT nasal spray Place 1 spray into both nostrils daily.     hydrOXYzine (VISTARIL) 50 MG capsule Take 1 capsule (50 mg total) by mouth 3 (three) times daily as needed. 30 capsule 0   losartan (COZAAR) 50 MG tablet Take 1 tablet by mouth daily.     mupirocin ointment (BACTROBAN) 2 % Apply 1 Application topically 2 (two) times daily. (Patient not taking: Reported on 07/23/2022) 22 g 0   Pertinent GI related medications were reviewed with the patient  Inpatient Medications  Current Facility-Administered Medications:    acetaminophen (TYLENOL) tablet 650 mg, 650 mg, Oral, Q6H PRN **OR** acetaminophen (TYLENOL) suppository 650 mg, 650 mg, Rectal, Q6H PRN, Dominguez, Dean V, MD   folic acid (FOLVITE) tablet 1 mg, 1 mg, Oral, Daily, Dominguez, Dean V, MD   lactated ringers infusion, , Intravenous, Continuous, Dominguez, Dean V, MD, Last Rate: 125 mL/hr at 07/24/22 0046, New Bag at 07/24/22 0046   LORazepam (ATIVAN) tablet 1-4 mg, 1-4 mg, Oral, Q1H PRN **OR** LORazepam (ATIVAN) injection 1-4 mg, 1-4 mg, Intravenous, Q1H PRN, Dominguez, Dean V, MD, 2 mg at 07/24/22 0419   morphine (PF) 2 MG/ML injection 2 mg, 2 mg, Intravenous, Q2H PRN, Dominguez, Dean V, MD   multivitamin with minerals tablet 1 tablet, 1 tablet, Oral, Daily, Dominguez, Dean V, MD    nicotine (NICODERM CQ - dosed in mg/24 hours) patch 21 mg, 21 mg, Transdermal, Daily, Dominguez, Dean V, MD, 21 mg at 07/24/22 0417   ondansetron (ZOFRAN) tablet 4 mg, 4 mg, Oral, Q6H PRN **OR** ondansetron (ZOFRAN) injection 4 mg, 4 mg, Intravenous, Q6H PRN, Dominguez, Dean V, MD   [START ON 07/27/2022] pantoprazole (PROTONIX) injection 40 mg, 40 mg, Intravenous, Q12H, Dominguez, Dean V, MD   pantoprozole (PROTONIX) 80 mg /NS 100 mL infusion, 8 mg/hr, Intravenous, Continuous, Dominguez, Dean V, MD, Last Rate: 10 mL/hr at 07/24/22 0047, 8 mg/hr at 07/24/22 0047   thiamine (VITAMIN B1) tablet 100 mg, 100 mg, Oral, Daily **OR** thiamine (VITAMIN B1) injection 100 mg, 100 mg, Intravenous, Daily, Dominguez, Dean V, MD  Current Outpatient Medications:    FLUoxetine (PROZAC) 40 MG capsule, Take 40 mg by mouth daily., Disp: , Rfl:    fluticasone (FLONASE) 50 MCG/ACT nasal spray, Place 1 spray into both   nostrils daily., Disp: , Rfl:    hydrOXYzine (VISTARIL) 50 MG capsule, Take 1 capsule (50 mg total) by mouth 3 (three) times daily as needed., Disp: 30 capsule, Rfl: 0   losartan (COZAAR) 50 MG tablet, Take 1 tablet by mouth daily., Disp: , Rfl:    mupirocin ointment (BACTROBAN) 2 %, Apply 1 Application topically 2 (two) times daily. (Patient not taking: Reported on 07/23/2022), Disp: 22 g, Rfl: 0  lactated ringers 125 mL/hr at 07/24/22 0046   pantoprazole 8 mg/hr (07/24/22 0047)    acetaminophen **OR** acetaminophen, LORazepam **OR** LORazepam, morphine injection, ondansetron **OR** ondansetron (ZOFRAN) IV   Objective   Vitals:   07/24/22 0400 07/24/22 0410 07/24/22 0500 07/24/22 0600  BP:  135/76  (!) 172/88  Pulse: 89 89 87 79  Resp: (!) 21  15 (!) 23  Temp:      TempSrc:      SpO2: 100%  92% 100%  Weight:      Height:         Physical Exam Vitals and nursing note reviewed.  Constitutional:      General: He is not in acute distress.    Appearance: Normal appearance. He is not ill-appearing,  toxic-appearing or diaphoretic.  HENT:     Head: Normocephalic and atraumatic.     Nose: Nose normal.     Mouth/Throat:     Mouth: Mucous membranes are moist.     Pharynx: Oropharynx is clear.  Eyes:     General: No scleral icterus.    Extraocular Movements: Extraocular movements intact.  Cardiovascular:     Rate and Rhythm: Normal rate and regular rhythm.     Heart sounds: Normal heart sounds. No murmur heard.    No friction rub. No gallop.  Pulmonary:     Effort: Pulmonary effort is normal. No respiratory distress.     Breath sounds: Normal breath sounds. No wheezing, rhonchi or rales.  Abdominal:     General: Abdomen is flat. Bowel sounds are normal. There is no distension.     Palpations: Abdomen is soft.     Tenderness: There is no abdominal tenderness. There is no guarding or rebound.  Musculoskeletal:     Cervical back: Neck supple.     Right lower leg: No edema.     Left lower leg: No edema.  Skin:    General: Skin is warm and dry.     Coloration: Skin is not jaundiced or pale.  Neurological:     General: No focal deficit present.     Mental Status: He is alert and oriented to person, place, and time. Mental status is at baseline.  Psychiatric:        Mood and Affect: Mood normal.        Behavior: Behavior normal.        Thought Content: Thought content normal.        Judgment: Judgment normal.     Laboratory Data Recent Labs  Lab 07/23/22 2123 07/24/22 0045  WBC 7.9  --   HGB 11.3* 9.9*  HCT 35.2*  --   PLT 356  --    Recent Labs  Lab 07/23/22 2123  NA 138  K 4.6  CL 106  CO2 24  BUN 31*  CALCIUM 7.8*  PROT 5.4*  BILITOT 0.6  ALKPHOS 61  ALT 7  AST 14*  GLUCOSE 159*   Recent Labs  Lab 07/24/22 0045  INR 1.1    Recent Labs      07/23/22 2123  LIPASE 30        Imaging Studies: No results found.  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  Transfusion and resuscitation as per primary team Avoid frequent lab draws to prevent lab induced anemia Supportive care and antiemetics as per primary team Maintain two sites IV access Avoid nsaids Monitor for GIB. CIWA protocol Reglan 10mg iv now  Esophagogastroduodenoscopy with possible biopsy, control of bleeding, polypectomy, and interventions as necessary has been discussed with the patient/patient representative. Informed consent was obtained from the patient/patient representative after explaining the indication, nature, and risks of the procedure including but not limited to death, bleeding, perforation, missed neoplasm/lesions, cardiorespiratory compromise, and reaction to medications. Opportunity for questions was given and appropriate answers were provided. Patient/patient representative has verbalized understanding is amenable to undergoing the procedure.   I personally performed the service.  Management of other medical comorbidities as per primary team  Thank you for allowing us to participate in this patient's care. Please don't hesitate to call if any questions or concerns arise.   Bryli Mantey Michael Lyric Hoar, DO [facility]  Portions of the record may have been created with voice recognition software. Occasional wrong-word or 'sound-a-like' substitutions may have occurred due to the inherent limitations of voice recognition software.  Read the chart carefully and recognize, using  context, where substitutions may have occurred.  

## 2022-07-24 NOTE — Discharge Summary (Signed)
Physician Discharge Summary   Patient: Dean Dominguez MRN: [id number] DOB: [birth year]  Admit date:     07/23/2022  Discharge date: 07/24/22  Discharge Physician: Lurene Shadow   PCP: Luciana Axe, NP   Recommendations at discharge:   Follow-up with PCP in 2 weeks Avoid NSAIDs including but not limited to diclofenac, ibuprofen, naproxen Reduce alcohol intake avoid alcohol completely Avoid cigarette smoking  Discharge Diagnoses: Principal Problem:   Hematemesis Active Problems:   Melena   History of peptic ulcer   ABLA (acute blood loss anemia)   NSAID long-term use   Chronic pain of both knees   Hypotension   Alcohol use disorder, moderate, dependence (HCC)   Anxiety   Essential hypertension   Tobacco use disorder   Duodenal ulcer  Resolved Problems:   * No resolved hospital problems. *  Hospital Course:  AHMON GAIR III is a 62 y.o. male with medical history significant for alcohol use disorder (5-6 beers a day), hypertension, anxiety, chronic knee pain with daily NSAID use (naproxen 500 mg twice a day) remote history of peptic ulcer disease, who presented to the hospital because of vomiting coffee-ground material and black stools.  Reportedly, when EMS arrived, he was on the commode and his blood pressure was 83/49.  He was given 500 mL fluid bolus and his BP was 128/86 by the time he got to the emergency department.  He was admitted to the hospital for acute upper GI bleeding.  He was treated with IV Protonix and IV fluids.  He was seen elevated by Dr. Timothy Lasso, gastroenterologist.  He underwent EGD which showed nonbleeding duodenal ulcers with a flat pigmented spot.  Dr. Timothy Lasso, recommended Protonix 40 mg twice daily for 8 weeks at discharge. He developed acute blood loss anemia.  Hemoglobin dropped from 11.3-9.5.  He was asymptomatic from this and there was no indication for blood transfusion.  GI bleeding has resolved.  He has been advised to avoid NSAIDs.  He  said he was given "a shot of cortisone" in the right knee for severe osteoarthritis about 2 weeks prior to admission.  Outpatient follow-up with his orthopedic surgeon recommended for further management of right knee arthritis.  His condition has improved and is deemed stable for discharge to home today.           Consultants: Gastroenterologist Procedures performed: EGD Disposition: Home Diet recommendation:  Cardiac diet DISCHARGE MEDICATION: Allergies as of 07/24/2022   No Known Allergies      Medication List     STOP taking these medications    mupirocin ointment 2 % Commonly known as: BACTROBAN       TAKE these medications    FLUoxetine 40 MG capsule Commonly known as: PROZAC Take 40 mg by mouth daily.   fluticasone 50 MCG/ACT nasal spray Commonly known as: FLONASE Place 1 spray into both nostrils daily.   hydrOXYzine 50 MG capsule Commonly known as: VISTARIL Take 1 capsule (50 mg total) by mouth 3 (three) times daily as needed.   losartan 50 MG tablet Commonly known as: COZAAR Take 1 tablet by mouth daily.   pantoprazole 40 MG tablet Commonly known as: Protonix Take 1 tablet (40 mg total) by mouth 2 (two) times daily.        Discharge Exam: Filed Weights   07/23/22 2110 07/24/22 1239  Weight: 67.2 kg 68.3 kg   GEN: NAD SKIN: Warm and dry EYES: EOMI ENT: MMM CV: RRR PULM: CTA B ABD: soft,  ND, NT, +BS CNS: AAO x 3, non focal EXT: No edema or tenderness   Condition at discharge: good  The results of significant diagnostics from this hospitalization (including imaging, microbiology, ancillary and laboratory) are listed below for reference.   Imaging Studies: No results found.  Microbiology: Results for orders placed or performed during the hospital encounter of 10/11/21  SARS Coronavirus 2 by RT PCR (hospital order, performed in [facility] hospital lab) *cepheid single result test* Anterior Nasal Swab     Status: Abnormal    Collection Time: 10/11/21  8:52 AM   Specimen: Anterior Nasal Swab  Result Value Ref Range Status   SARS Coronavirus 2 by RT PCR POSITIVE (A) NEGATIVE Final    Comment: (NOTE) SARS-CoV-2 target nucleic acids are DETECTED  SARS-CoV-2 RNA is generally detectable in upper respiratory specimens  during the acute phase of infection.  Positive results are indicative  of the presence of the identified virus, but do not rule out bacterial infection or co-infection with other pathogens not detected by the test.  Clinical correlation with patient history and  other diagnostic information is necessary to determine patient infection status.  The expected result is negative.  Fact Sheet for Patients:   RoadLapTop.co.za   Fact Sheet for Healthcare Providers:   http://kim-miller.com/    This test is not yet approved or cleared by the [place] FDA and  has been authorized for detection and/or diagnosis of SARS-CoV-2 by FDA under an Emergency Use Authorization (EUA).  This EUA will remain in effect (meaning this test can be used) for the duration of  the COVID-19 declaration under Section 564(b)(1)  of the Act, 21 U.S.C. section 360-bbb-3(b)(1), unless the authorization is terminated or revoked sooner.   Performed at [facility], [address] [place], [place] [address]     Labs: CBC: Recent Labs  Lab 07/23/22 2123 07/24/22 0045 07/24/22 0646 07/24/22 1302  WBC 7.9  --   --   --   NEUTROABS 5.4  --   --   --   HGB 11.3* 9.9* 9.5* 9.5*  HCT 35.2*  --   --   --   MCV 105.7*  --   --   --   PLT 356  --   --   --    Basic Metabolic Panel: Recent Labs  Lab 07/23/22 2123  NA 138  K 4.6  CL 106  CO2 24  GLUCOSE 159*  BUN 31*  CREATININE 0.99  CALCIUM 7.8*   Liver Function Tests: Recent Labs  Lab 07/23/22 2123  AST 14*  ALT 7  ALKPHOS 61  BILITOT 0.6  PROT 5.4*  ALBUMIN 2.8*   CBG: No results for  input(s): "GLUCAP" in the last 168 hours.  Discharge time spent: greater than 30 minutes.  Signed: Lurene Shadow, MD [facility] 07/24/2022

## 2022-07-24 NOTE — ED Notes (Signed)
Pt. Has sleep apnea while napping this AM, sats dropping to low 80's intermittently while sleeping, 2L [place] O2 applied.

## 2022-07-24 NOTE — Plan of Care (Signed)
  Problem: Education: Goal: Knowledge of General Education information will improve Description: Including pain rating scale, medication(s)/side effects and non-pharmacologic comfort measures Outcome: Progressing   Problem: Activity: Goal: Risk for activity intolerance will decrease Outcome: Progressing   Problem: Coping: Goal: Level of anxiety will decrease Outcome: Progressing   

## 2022-07-24 NOTE — Anesthesia Preprocedure Evaluation (Signed)
Anesthesia Evaluation  Patient identified by MRN, date of birth, ID band Patient awake    Reviewed: Allergy & Precautions, NPO status , Patient's Chart, lab work & pertinent test results  History of Anesthesia Complications Negative for: history of anesthetic complications  Airway Mallampati: III  TM Distance: >3 FB Neck ROM: Full    Dental  (+) Upper Dentures, Lower Dentures, Dental Advidsory Given   Pulmonary neg shortness of breath, neg sleep apnea, neg COPD, neg recent URI, Current Smoker   breath sounds clear to auscultation- rhonchi (-) wheezing      Cardiovascular Exercise Tolerance: Good hypertension, (-) angina (-) CAD, (-) Past MI, (-) Cardiac Stents and (-) CABG (-) dysrhythmias  Rhythm:Regular Rate:Normal - Systolic murmurs and - Diastolic murmurs    Neuro/Psych neg Seizures PSYCHIATRIC DISORDERS  Depression    negative neurological ROS     GI/Hepatic negative GI ROS, Neg liver ROS,,,  Endo/Other  negative endocrine ROSneg diabetes    Renal/GU negative Renal ROS     Musculoskeletal  (+) Arthritis ,    Abdominal  (+) - obese  Peds  Hematology  (+) Blood dyscrasia, anemia   Anesthesia Other Findings Past Medical History: No date: Arthritis     Comment:  hands & knees No date: Depression No date: Dyspnea     Comment:  DOE   Reproductive/Obstetrics                             Anesthesia Physical Anesthesia Plan  ASA: 3  Anesthesia Plan: General   Post-op Pain Management: Minimal or no pain anticipated   Induction: Intravenous  PONV Risk Score and Plan: 3 and Propofol infusion, TIVA and Ondansetron  Airway Management Planned: Nasal Cannula and Natural Airway  Additional Equipment: None  Intra-op Plan:   Post-operative Plan: Extubation in OR  Informed Consent: I have reviewed the patients History and Physical, chart, labs and discussed the procedure including the  risks, benefits and alternatives for the proposed anesthesia with the patient or authorized representative who has indicated his/her understanding and acceptance.     Dental advisory given  Plan Discussed with: CRNA and Surgeon  Anesthesia Plan Comments: (Discussed risks of anesthesia with patient, including possibility of difficulty with spontaneous ventilation under anesthesia necessitating airway intervention, PONV, and rare risks such as cardiac or respiratory or neurological events, and allergic reactions. Discussed the role of CRNA in patient's perioperative care. Patient understands.)        Anesthesia Quick Evaluation

## 2022-07-24 NOTE — Consult Note (Signed)
Inpatient Consultation   Patient ID: Dean Dominguez is a 62 y.o. male.  Requesting Provider: Lindajo Royal, MD  Date of Admission: 07/23/2022  Date of Consult: 07/24/22   Reason for Consultation: upper gi bleed   Patient's Chief Complaint:   Chief Complaint  Patient presents with   Hematemesis    62 y/o CM with etoh and alcohol dependence, nsaid use, anxiety and htn whom GI is consulted for possible UGIB.  Patient noted he started having dark stools 2 days ago.  Yesterday he had coffee-ground emesis which evolved into some bright red blood.  Patient has been taking 500 mg of Naprosyn twice a day.  He drinks 5-6 beers per day.  He was initially hypotensive at presentation and improved with IVF Resuscitation. Now hemodynamically stable.  No abdominal pain. No further vomiting this morning. No diarrhea/constipation.  Hemoglobin is macrocytic on presentation at 11.3 MCV 106.  It is now down to 9.5 after IV fluid resuscitation  Naprosyn BID Denies Anti-plt agents, and anticoagulants Denies family history of gastrointestinal disease and malignancy Previous Endoscopies:  none    Past Medical History:  Diagnosis Date   Arthritis    hands & knees   Depression    Dyspnea    DOE (pt denies 02/16/18)   Hx MRSA infection    Hypertension    Tobacco dependence     Past Surgical History:  Procedure Laterality Date   ANTERIOR CRUCIATE LIGAMENT REPAIR Right 1999   CATARACT EXTRACTION W/PHACO Left 04/29/2017   Procedure: CATARACT EXTRACTION PHACO AND INTRAOCULAR LENS PLACEMENT (IOC);  Surgeon: Galen Manila, MD;  Location: [facility];  Service: Ophthalmology;  Laterality: Left;  [place] 00:34 AP% 17.6 CDE 6.09 Fluid pak lot # [id number]   CATARACT EXTRACTION W/PHACO Right 05/19/2017   Procedure: CATARACT EXTRACTION PHACO AND INTRAOCULAR LENS PLACEMENT (IOC);  Surgeon: Galen Manila, MD;  Location: [facility];  Service: Ophthalmology;  Laterality: Right;  [place] 00:30 AP% 14.6 CDE  4.51 Fluid pack lot # [id number]   IR EXCHANGE BILIARY DRAIN  01/26/2020   METAL PLATES  left foot Left 2018   SHOULDER ARTHROSCOPY Left 02/18/2018   Procedure: ARTHROSCOPY SHOULDER WITH EXTENSIVE DEBRIDEMENT, ROTATOR CUFF REPAIR AND SUBACROMIAL DECOMPRESSION;  Surgeon: Signa Kell, MD;  Location: [facility];  Service: Orthopedics;  Laterality: Left;  ARTHREX SWIVELOCK 4.75 MM  OTHER ARTHROCARE WAND BARROL BURR OPEN SHID [employer]   TENDON REPAIR Left 01/09/2017   Procedure: TENDON REPAIR-TIBIALIS TENDON;  Surgeon: Gwyneth Revels, DPM;  Location: [facility];  Service: Podiatry;  Laterality: Left;   TONSILLECTOMY     WISDOM TOOTH EXTRACTION  1978   2 wisdom teeth extracted    No Known Allergies  Family History  Problem Relation Age of Onset   Hypertension Mother    Healthy Father     Social History   Tobacco Use   Smoking status: Every Day    Packs/day: 1.00    Years: 40.00    Additional pack years: 0.00    Total pack years: 40.00    Types: Cigarettes   Smokeless tobacco: Former    Quit date: 02/03/1997  Vaping Use   Vaping Use: Never used  Substance Use Topics   Alcohol use: Yes    Alcohol/week: 6.0 standard drinks of alcohol    Types: 6 Cans of beer per week   Drug use: Yes    Frequency: 2.0 times per week    Types: Marijuana    Comment: used  10 days ago     Pertinent GI related history and allergies were reviewed with the patient  Review of Systems  Constitutional:  Negative for activity change, appetite change, chills, diaphoresis, fatigue, fever and unexpected weight change.  HENT:  Negative for trouble swallowing and voice change.   Respiratory:  Negative for shortness of breath and wheezing.   Cardiovascular:  Negative for chest pain, palpitations and leg swelling.  Gastrointestinal:  Positive for nausea and vomiting. Negative for abdominal distention, abdominal pain, anal bleeding, blood in stool, constipation, diarrhea and rectal pain.       +  melena, coffee ground emesis, hematemesis  Musculoskeletal:  Negative for arthralgias and myalgias.  Skin:  Negative for color change and pallor.  Neurological:  Positive for dizziness and light-headedness. Negative for syncope and weakness.  Psychiatric/Behavioral:  Negative for confusion. The patient is not nervous/anxious.   All other systems reviewed and are negative.    Medications Home Medications No current facility-administered medications on file prior to encounter.   Current Outpatient Medications on File Prior to Encounter  Medication Sig Dispense Refill   FLUoxetine (PROZAC) 40 MG capsule Take 40 mg by mouth daily.     fluticasone (FLONASE) 50 MCG/ACT nasal spray Place 1 spray into both nostrils daily.     hydrOXYzine (VISTARIL) 50 MG capsule Take 1 capsule (50 mg total) by mouth 3 (three) times daily as needed. 30 capsule 0   losartan (COZAAR) 50 MG tablet Take 1 tablet by mouth daily.     mupirocin ointment (BACTROBAN) 2 % Apply 1 Application topically 2 (two) times daily. (Patient not taking: Reported on 07/23/2022) 22 g 0   Pertinent GI related medications were reviewed with the patient  Inpatient Medications  Current Facility-Administered Medications:    acetaminophen (TYLENOL) tablet 650 mg, 650 mg, Oral, Q6H PRN **OR** acetaminophen (TYLENOL) suppository 650 mg, 650 mg, Rectal, Q6H PRN, Andris Baumann, MD   folic acid (FOLVITE) tablet 1 mg, 1 mg, Oral, Daily, Andris Baumann, MD   lactated ringers infusion, , Intravenous, Continuous, Andris Baumann, MD, Last Rate: 125 mL/hr at 07/24/22 0046, New Bag at 07/24/22 0046   LORazepam (ATIVAN) tablet 1-4 mg, 1-4 mg, Oral, Q1H PRN **OR** LORazepam (ATIVAN) injection 1-4 mg, 1-4 mg, Intravenous, Q1H PRN, Andris Baumann, MD, 2 mg at 07/24/22 0419   morphine (PF) 2 MG/ML injection 2 mg, 2 mg, Intravenous, Q2H PRN, Andris Baumann, MD   multivitamin with minerals tablet 1 tablet, 1 tablet, Oral, Daily, Para March, Odetta Pink, MD    nicotine (NICODERM CQ - dosed in mg/24 hours) patch 21 mg, 21 mg, Transdermal, Daily, Lindajo Royal V, MD, 21 mg at 07/24/22 0417   ondansetron (ZOFRAN) tablet 4 mg, 4 mg, Oral, Q6H PRN **OR** ondansetron (ZOFRAN) injection 4 mg, 4 mg, Intravenous, Q6H PRN, Andris Baumann, MD   [START ON 07/27/2022] pantoprazole (PROTONIX) injection 40 mg, 40 mg, Intravenous, Q12H, Andris Baumann, MD   pantoprozole (PROTONIX) 80 mg /NS 100 mL infusion, 8 mg/hr, Intravenous, Continuous, Lindajo Royal V, MD, Last Rate: 10 mL/hr at 07/24/22 0047, 8 mg/hr at 07/24/22 0047   thiamine (VITAMIN B1) tablet 100 mg, 100 mg, Oral, Daily **OR** thiamine (VITAMIN B1) injection 100 mg, 100 mg, Intravenous, Daily, Andris Baumann, MD  Current Outpatient Medications:    FLUoxetine (PROZAC) 40 MG capsule, Take 40 mg by mouth daily., Disp: , Rfl:    fluticasone (FLONASE) 50 MCG/ACT nasal spray, Place 1 spray into both  nostrils daily., Disp: , Rfl:    hydrOXYzine (VISTARIL) 50 MG capsule, Take 1 capsule (50 mg total) by mouth 3 (three) times daily as needed., Disp: 30 capsule, Rfl: 0   losartan (COZAAR) 50 MG tablet, Take 1 tablet by mouth daily., Disp: , Rfl:    mupirocin ointment (BACTROBAN) 2 %, Apply 1 Application topically 2 (two) times daily. (Patient not taking: Reported on 07/23/2022), Disp: 22 g, Rfl: 0  lactated ringers 125 mL/hr at 07/24/22 0046   pantoprazole 8 mg/hr (07/24/22 0047)    acetaminophen **OR** acetaminophen, LORazepam **OR** LORazepam, morphine injection, ondansetron **OR** ondansetron (ZOFRAN) IV   Objective   Vitals:   07/24/22 0400 07/24/22 0410 07/24/22 0500 07/24/22 0600  BP:  135/76  (!) 172/88  Pulse: 89 89 87 79  Resp: (!) 21  15 (!) 23  Temp:      TempSrc:      SpO2: 100%  92% 100%  Weight:      Height:         Physical Exam Vitals and nursing note reviewed.  Constitutional:      General: He is not in acute distress.    Appearance: Normal appearance. He is not ill-appearing,  toxic-appearing or diaphoretic.  HENT:     Head: Normocephalic and atraumatic.     Nose: Nose normal.     Mouth/Throat:     Mouth: Mucous membranes are moist.     Pharynx: Oropharynx is clear.  Eyes:     General: No scleral icterus.    Extraocular Movements: Extraocular movements intact.  Cardiovascular:     Rate and Rhythm: Normal rate and regular rhythm.     Heart sounds: Normal heart sounds. No murmur heard.    No friction rub. No gallop.  Pulmonary:     Effort: Pulmonary effort is normal. No respiratory distress.     Breath sounds: Normal breath sounds. No wheezing, rhonchi or rales.  Abdominal:     General: Abdomen is flat. Bowel sounds are normal. There is no distension.     Palpations: Abdomen is soft.     Tenderness: There is no abdominal tenderness. There is no guarding or rebound.  Musculoskeletal:     Cervical back: Neck supple.     Right lower leg: No edema.     Left lower leg: No edema.  Skin:    General: Skin is warm and dry.     Coloration: Skin is not jaundiced or pale.  Neurological:     General: No focal deficit present.     Mental Status: He is alert and oriented to person, place, and time. Mental status is at baseline.  Psychiatric:        Mood and Affect: Mood normal.        Behavior: Behavior normal.        Thought Content: Thought content normal.        Judgment: Judgment normal.     Laboratory Data Recent Labs  Lab 07/23/22 2123 07/24/22 0045  WBC 7.9  --   HGB 11.3* 9.9*  HCT 35.2*  --   PLT 356  --    Recent Labs  Lab 07/23/22 2123  NA 138  K 4.6  CL 106  CO2 24  BUN 31*  CALCIUM 7.8*  PROT 5.4*  BILITOT 0.6  ALKPHOS 61  ALT 7  AST 14*  GLUCOSE 159*   Recent Labs  Lab 07/24/22 0045  INR 1.1    Recent Labs  07/23/22 2123  LIPASE 30        Imaging Studies: No results found.  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  Transfusion and resuscitation as per primary team Avoid frequent lab draws to prevent lab induced anemia Supportive care and antiemetics as per primary team Maintain two sites IV access Avoid nsaids Monitor for GIB. CIWA protocol Reglan 10mg  iv now  Esophagogastroduodenoscopy with possible biopsy, control of bleeding, polypectomy, and interventions as necessary has been discussed with the patient/patient representative. Informed consent was obtained from the patient/patient representative after explaining the indication, nature, and risks of the procedure including but not limited to death, bleeding, perforation, missed neoplasm/lesions, cardiorespiratory compromise, and reaction to medications. Opportunity for questions was given and appropriate answers were provided. Patient/patient representative has verbalized understanding is amenable to undergoing the procedure.   I personally performed the service.  Management of other medical comorbidities as per primary team  Thank you for allowing [place] to participate in this patient's care. Please don't hesitate to call if any questions or concerns arise.   Jaynie Egge, DO [facility]  Portions of the record may have been created with voice recognition software. Occasional wrong-word or 'sound-a-like' substitutions may have occurred due to the inherent limitations of voice recognition software.  Read the chart carefully and recognize, using  context, where substitutions may have occurred.

## 2022-07-24 NOTE — Interval H&P Note (Signed)
History and Physical Interval Note: Preprocedure H&P from 07/24/22  was reviewed and there was no interval change after seeing and examining the patient.  Written consent was obtained from the patient after discussion of risks, benefits, and alternatives. Patient has consented to proceed with Esophagogastroduodenoscopy with possible intervention   07/24/2022 11:08 AM  Dean Dominguez  has presented today for surgery, with the diagnosis of melena, hematemesis.  The various methods of treatment have been discussed with the patient and family. After consideration of risks, benefits and other options for treatment, the patient has consented to  Procedure(s): ESOPHAGOGASTRODUODENOSCOPY (EGD) WITH PROPOFOL (N/A) as a surgical intervention.  The patient's history has been reviewed, patient examined, no change in status, stable for surgery.  I have reviewed the patient's chart and labs.  Questions were answered to the patient's satisfaction.     Jaynie Laye

## 2022-07-24 NOTE — Op Note (Signed)
[facility] Patient Name: Dean Dominguez Procedure Date: 07/24/2022 11:11 AM MRN: [id number] Account #: [id number] Date of Birth: [birth year] Admit Type: Inpatient Age: 62 Room: [facility] ENDO ROOM 1 Gender: Male Note Status: Finalized Instrument Name: Upper Endoscope [id number] Procedure:             Upper GI endoscopy Indications:           Melena Providers:             Jaynie Smoak DO, DO Referring MD:          No Local Md, MD (Referring MD) Medicines:             Monitored Anesthesia Care Complications:         No immediate complications. Estimated blood loss:                         Minimal. Procedure:             Pre-Anesthesia Assessment:                        - Prior to the procedure, a History and Physical was                         performed, and patient medications and allergies were                         reviewed. The patient is competent. The risks and                         benefits of the procedure and the sedation options and                         risks were discussed with the patient. All questions                         were answered and informed consent was obtained.                         Patient identification and proposed procedure were                         verified by the physician, the nurse, the anesthetist                         and the technician in the endoscopy suite. Mental                         Status Examination: alert and oriented. Airway                         Examination: normal oropharyngeal airway and neck                         mobility. Respiratory Examination: clear to                         auscultation. CV Examination: RRR, no murmurs, no S3  or S4. Prophylactic Antibiotics: The patient does not                         require prophylactic antibiotics. Prior                         Anticoagulants: The patient has taken no anticoagulant                         or  antiplatelet agents. ASA Grade Assessment: III - A                         patient with severe systemic disease. After reviewing                         the risks and benefits, the patient was deemed in                         satisfactory condition to undergo the procedure. The                         anesthesia plan was to use monitored anesthesia care                         (MAC). Immediately prior to administration of                         medications, the patient was re-assessed for adequacy                         to receive sedatives. The heart rate, respiratory                         rate, oxygen saturations, blood pressure, adequacy of                         pulmonary ventilation, and response to care were                         monitored throughout the procedure. The physical                         status of the patient was re-assessed after the                         procedure.                        After obtaining informed consent, the endoscope was                         passed under direct vision. Throughout the procedure,                         the patient's blood pressure, pulse, and oxygen                         saturations were monitored continuously. The Endoscope  was introduced through the mouth, and advanced to the                         second part of duodenum. The upper GI endoscopy was                         accomplished without difficulty. The patient tolerated                         the procedure well. Findings:      Three non-bleeding cratered duodenal ulcers with a flat pigmented spot       (Forrest Class IIc) were found in the first portion of the duodenum. The       largest lesion was 6 mm in largest dimension. Biopsies were taken with a       cold forceps for histology. Estimated blood loss was minimal.      The entire examined stomach was normal. Biopsies were taken with a cold       forceps for Helicobacter pylori  testing. Estimated blood loss was       minimal.      A small hiatal hernia was present. Estimated blood loss: none.      The Z-line was irregular. C0M2 of tongues of salmon colored mucosa.       Biopsies were taken with a cold forceps for histology. Estimated blood       loss was minimal.      Esophagogastric landmarks were identified: the gastroesophageal junction       was found at 40 cm from the incisors.      The exam of the esophagus was otherwise normal. Impression:            - Non-bleeding duodenal ulcers with a flat pigmented                         spot (Forrest Class IIc). Biopsied.                        - Normal stomach. Biopsied.                        - Small hiatal hernia.                        - Z-line irregular. Biopsied.                        - Esophagogastric landmarks identified. Recommendation:        - Patient has a contact number available for                         emergencies. The signs and symptoms of potential                         delayed complications were discussed with the patient.                         Return to normal activities tomorrow. Written                         discharge instructions were provided to the patient.                        -  Discharge patient to home.                        - Resume previous diet.                        - Continue present medications.                        - Use Protonix (pantoprazole) 40 mg PO BID for 8 weeks.                        - Await pathology results.                        - No ibuprofen, naproxen, or other non-steroidal                         anti-inflammatory drugs.                        - Repeat upper endoscopy in 2 months to evaluate the                         response to therapy.                        - Return to GI office at appointment to be scheduled.                        - The findings and recommendations were discussed with                         the patient.                         - The findings and recommendations were discussed with                         the referring physician. Procedure Code(s):     --- Professional ---                        [phone], Esophagogastroduodenoscopy, flexible,                         transoral; with biopsy, single or multiple Diagnosis Code(s):     --- Professional ---                        K26.9, Duodenal ulcer, unspecified as acute or                         chronic, without hemorrhage or perforation                        K44.9, Diaphragmatic hernia without obstruction or                         gangrene                        K22.89, Other specified disease of esophagus  K92.1, Melena (includes Hematochezia) CPT copyright 2022 American Medical Association. All rights reserved. The codes documented in this report are preliminary and upon coder review may  be revised to meet current compliance requirements. Attending Participation:      I personally performed the entire procedure. Elfredia Nevins, DO Jaynie Habibi DO, DO 07/24/2022 11:29:57 AM This report has been signed electronically. Number of Addenda: 0 Note Initiated On: 07/24/2022 11:11 AM Estimated Blood Loss:  Estimated blood loss was minimal.      [facility]

## 2022-07-24 NOTE — Anesthesia Postprocedure Evaluation (Signed)
Anesthesia Post Note  Patient: [name]  Procedure(s) Performed: ESOPHAGOGASTRODUODENOSCOPY (EGD) WITH PROPOFOL  Patient location during evaluation: Endoscopy Anesthesia Type: General Level of consciousness: awake and alert Pain management: pain level controlled Vital Signs Assessment: post-procedure vital signs reviewed and stable Respiratory status: spontaneous breathing, nonlabored ventilation, respiratory function stable and patient connected to nasal cannula oxygen Cardiovascular status: blood pressure returned to baseline and stable Postop Assessment: no apparent nausea or vomiting Anesthetic complications: no  No notable events documented.   Last Vitals:  Vitals:   07/24/22 1136 07/24/22 1146  BP: 112/62 (!) 138/91  Pulse: 65 70  Resp: 12 20  Temp:    SpO2: 99% 100%    Last Pain:  Vitals:   07/24/22 1146  TempSrc:   PainSc: 0-No pain                 Stephanie Coup

## 2022-07-24 NOTE — Care Plan (Signed)
Brief GI Post op note  See op report for further details Large duodenal ulcers. One of which with pigmented spot. Biopsies taken. Continue twice a day protonix 40 mg for 8 weeks. Recommend repeat egd in 8-12 weeks Complete nsaid cessation. No further intervention planned at this time. Ok to resume diet. D/w patient and hospitalist.  GI to sign off. Available as needed. Please do not hesitate to call regarding questions or concerns.  Enis Slipper, DO [facility]astroenter[facility]

## 2022-07-24 NOTE — Transfer of Care (Signed)
Immediate Anesthesia Transfer of Care Note  Patient: Dean Dominguez  Procedure(s) Performed: ESOPHAGOGASTRODUODENOSCOPY (EGD) WITH PROPOFOL  Patient Location: Endoscopy Unit  Anesthesia Type:General  Level of Consciousness: drowsy  Airway & Oxygen Therapy: Patient Spontanous Breathing  Post-op Assessment: Report given to RN  Post vital signs: stable  Last Vitals:  Vitals Value Taken Time  BP    Temp    Pulse    Resp    SpO2      Last Pain:  Vitals:   07/24/22 1047  TempSrc: Temporal  PainSc: 0-No pain         Complications: No notable events documented.

## 2022-07-25 ENCOUNTER — Encounter: Payer: Self-pay | Admitting: Gastroenterology

## 2023-01-18 ENCOUNTER — Ambulatory Visit
Admission: EM | Admit: 2023-01-18 | Discharge: 2023-01-18 | Disposition: A | Discharge status: Home / Self Care | Payer: 59

## 2023-01-18 ENCOUNTER — Ambulatory Visit: Payer: 59

## 2023-01-18 DIAGNOSIS — S40011A Contusion of right shoulder, initial encounter: Secondary | ICD-10-CM | POA: Diagnosis not present

## 2023-01-18 MED ORDER — OXYCODONE-ACETAMINOPHEN 5-325 MG PO TABS: 1.0000 | ORAL_TABLET | Freq: Four times a day (QID) | ORAL | 0 refills | Status: AC | PRN

## 2023-01-18 NOTE — Discharge Instructions (Addendum)
Your x-ray did not show any evidence of a fracture in your shoulder or your humerus.  I do believe you have bruised your shoulder significantly.  Apply ice to your shoulder for 20 minutes at a time, 2-3 times a day, as needed for pain and inflammation.  Use over-the-counter Tylenol and/or ibuprofen according the package instructions as needed for mild to moderate pain.  You can use the Percocet as needed for severe pain.  Be mindful this also contains Tylenol so do not take more than 4000 mg of Tylenol in total daily.  Also do not drink alcohol or drive if you take this.  Follow the shoulder range of motion exercises given your discharge instructions.  Do these at least once daily to help prevent loss of mobility in your shoulder.  If your shoulder is not improving in the next week I recommend that you follow-up with orthopedics.

## 2023-01-18 NOTE — ED Triage Notes (Signed)
Patient states that he tripped over a broom and fell and hit his right shoulder. Happened yesterday.

## 2023-01-18 NOTE — ED Provider Notes (Addendum)
[facility]    CSN: [id number] Arrival date & time: 01/18/23  1501      History   Chief Complaint Chief Complaint  Patient presents with   Shoulder Injury    HPI Dean Dominguez is a 62 y.o. male.   HPI  62 year old male with a past medical history significant for hypertension, tobacco dependence, arthritis, and depression presents for evaluation of pain in the anterior aspect of his right shoulder.  He reports that he tripped over a broom at the end of his driveway and landed on accommodation of the pavement and rocks.  He reports that the pain is all in the front of his shoulder and he has limited range of motion as a result of the pain.  He denies any numbness or tingling in his fingers.  No bruising.  Past Medical History:  Diagnosis Date   Arthritis    hands & knees   Depression    Dyspnea    DOE (pt denies 02/16/18)   Hx MRSA infection    Hypertension    Tobacco dependence     Patient Active Problem List   Diagnosis Date Noted   Duodenal ulcer 07/24/2022   Hematemesis 07/23/2022   Melena 07/23/2022   History of peptic ulcer 07/23/2022   NSAID long-term use 07/23/2022   ABLA (acute blood loss anemia) 07/23/2022   Alcohol use disorder, moderate, dependence (HCC) 07/23/2022   Hypotension 07/23/2022   Chronic pain of both knees 05/26/2022   Tobacco use disorder 05/03/2020   Left-sided Bell's palsy 03/23/2020   Cholecystitis, acute 01/19/2020   Essential hypertension 08/24/2019   Nontraumatic tear of right rotator cuff 03/03/2018   Hyperlipidemia 02/08/2014   Anxiety 09/16/2013   Depression 09/16/2013   Seasonal allergies 09/16/2013    Past Surgical History:  Procedure Laterality Date   ANTERIOR CRUCIATE LIGAMENT REPAIR Right 1999   BIOPSY  07/24/2022   Procedure: BIOPSY;  Surgeon: Jaynie Arch, DO;  Location: [facility] ENDOSCOPY;  Service: Gastroenterology;;   CATARACT EXTRACTION W/PHACO Left 04/29/2017   Procedure: CATARACT  EXTRACTION PHACO AND INTRAOCULAR LENS PLACEMENT (IOC);  Surgeon: Galen Manila, MD;  Location: [facility] ORS;  Service: Ophthalmology;  Laterality: Left;  [place] 00:34 AP% 17.6 CDE 6.09 Fluid pak lot # [id number]   CATARACT EXTRACTION W/PHACO Right 05/19/2017   Procedure: CATARACT EXTRACTION PHACO AND INTRAOCULAR LENS PLACEMENT (IOC);  Surgeon: Galen Manila, MD;  Location: [facility] ORS;  Service: Ophthalmology;  Laterality: Right;  [place] 00:30 AP% 14.6 CDE 4.51 Fluid pack lot # [id number]   ESOPHAGOGASTRODUODENOSCOPY (EGD) WITH PROPOFOL N/A 07/24/2022   Procedure: ESOPHAGOGASTRODUODENOSCOPY (EGD) WITH PROPOFOL;  Surgeon: Jaynie Kalla, DO;  Location: [facility] ENDOSCOPY;  Service: Gastroenterology;  Laterality: N/A;   IR EXCHANGE BILIARY DRAIN  01/26/2020   METAL PLATES  left foot Left 2018   SHOULDER ARTHROSCOPY Left 02/18/2018   Procedure: ARTHROSCOPY SHOULDER WITH EXTENSIVE DEBRIDEMENT, ROTATOR CUFF REPAIR AND SUBACROMIAL DECOMPRESSION;  Surgeon: Signa Kell, MD;  Location: [facility];  Service: Orthopedics;  Laterality: Left;  ARTHREX SWIVELOCK 4.75 MM  OTHER ARTHROCARE WAND BARROL BURR OPEN SHID KOLBALL HOMANS COBBS   TENDON REPAIR Left 01/09/2017   Procedure: TENDON REPAIR-TIBIALIS TENDON;  Surgeon: Gwyneth Revels, DPM;  Location: [facility] ORS;  Service: Podiatry;  Laterality: Left;   TONSILLECTOMY     WISDOM TOOTH EXTRACTION  1978   2 wisdom teeth extracted       Home Medications    Prior to Admission medications   Medication Sig Start  Date End Date Taking? Authorizing Provider  FLUoxetine (PROZAC) 20 MG capsule Take 20 mg by mouth daily.   Yes [provider]  FLUoxetine (PROZAC) 40 MG capsule Take 40 mg by mouth daily. 05/26/22  Yes [provider]  losartan (COZAAR) 50 MG tablet Take 1 tablet by mouth daily. 05/26/22  Yes [provider]  oxyCODONE-acetaminophen (PERCOCET/ROXICET) 5-325 MG tablet Take 1 tablet by mouth every 6 (six) hours as needed for  severe pain (pain score 7-10). 01/18/23  Yes Becky Augusta, NP  fluticasone (FLONASE) 50 MCG/ACT nasal spray Place 1 spray into both nostrils daily.    [provider]  hydrOXYzine (VISTARIL) 50 MG capsule Take 1 capsule (50 mg total) by mouth 3 (three) times daily as needed. 08/22/21   Varney Daily, PA  pantoprazole (PROTONIX) 40 MG tablet Take 1 tablet (40 mg total) by mouth 2 (two) times daily. 07/24/22 08/23/22  Lurene Shadow, MD    Family History Family History  Problem Relation Age of Onset   Hypertension Mother    Healthy Father     Social History Social History   Tobacco Use   Smoking status: Every Day    Current packs/day: 1.00    Average packs/day: 1 pack/day for 40.0 years (40.0 ttl pk-yrs)    Types: Cigarettes   Smokeless tobacco: Former    Quit date: 02/03/1997  Vaping Use   Vaping status: Never Used  Substance Use Topics   Alcohol use: Yes    Alcohol/week: 6.0 standard drinks of alcohol    Types: 6 Cans of beer per week    Comment: drank Mon 07/21/2022   Drug use: Yes    Frequency: 2.0 times per week    Types: Marijuana    Comment: 07/21/2022     Allergies   Patient has no known allergies.   Review of Systems Review of Systems  Musculoskeletal:  Positive for arthralgias. Negative for joint swelling.  Skin:  Negative for color change.     Physical Exam Triage Vital Signs ED Triage Vitals  Encounter Vitals Group     BP      Systolic BP Percentile      Diastolic BP Percentile      Pulse      Resp      Temp      Temp src      SpO2      Weight      Height      Head Circumference      Peak Flow      Pain Score      Pain Loc      Pain Education      Exclude from Growth Chart    No data found.  Updated Vital Signs BP (!) 168/85 (BP Location: Left Arm) Comment: 2nd read  Pulse 74   Temp 98.2 F (36.8 C)   Resp 17   SpO2 96%   Visual Acuity Right Eye Distance:   Left Eye Distance:   Bilateral Distance:    Right Eye  Near:   Left Eye Near:    Bilateral Near:     Physical Exam Vitals and nursing note reviewed.  Constitutional:      Appearance: Normal appearance. He is not ill-appearing.  HENT:     Head: Normocephalic and atraumatic.  Musculoskeletal:        General: Tenderness and signs of injury present. No swelling or deformity.  Skin:    General: Skin is warm and  dry.     Capillary Refill: Capillary refill takes less than 2 seconds.     Findings: No bruising or erythema.  Neurological:     General: No focal deficit present.     Mental Status: He is alert and oriented to person, place, and time.      UC Treatments / Results  Labs (all labs ordered are listed, but only abnormal results are displayed) Labs Reviewed - No data to display  EKG   Radiology No results found.  Procedures Procedures (including critical care time)  Medications Ordered in UC Medications - No data to display  Initial Impression / Assessment and Plan / UC Course  I have reviewed the triage vital signs and the nursing notes.  Pertinent labs & imaging results that were available during my care of the patient were reviewed by me and considered in my medical decision making (see chart for details).   Patient is a nontoxic-appearing 62 year old male presenting for evaluation of pain in the anterior aspect of his right glenohumeral joint after suffering a ground-level fall yesterday.  Visual inspection of the shoulder shows normal anatomical alignment and there is no ecchymosis, erythema, or edema.  He is point tender to palpation over the anterior aspect of the glenohumeral joint but no pain with palpation of the clavicle, acromion process, deltoid complex, or posterior aspect of the shoulder girdle.  He is able to achieve 90 degrees of extension with assistance of his left arm and then once there he can keep it at 90 degrees and even move it up over his head.  He just cannot move it from a neutral position to the 90  degree position.  I suspect the patient most likely has sustained soft tissue injury though I will obtain radiographs to rule out any acute bony abnormality.  Right shoulder films independently reviewed and evaluated by me.  Impression: No evidence of fracture or dislocation.  Radiology overread is pending. Radiology impression states normal shoulder radiographs.  I will discharge patient home with a diagnosis of right shoulder contusion and have him apply ice to his shoulder for 20 minutes at a time, 2-3 times a day to help with pain and inflammation.  He can also use over-the-counter Tylenol and or ibuprofen according the package instructions as needed for mild to moderate pain.  I will discharge him home with a short prescription for percocet for as needed for severe pain.  He has no open prescription and PDMP.  I will also give him shoulder range of motion exercises to perform at home to help improve mobility and prevent frozen shoulder.   Final Clinical Impressions(s) / UC Diagnoses   Final diagnoses:  Contusion of right shoulder, initial encounter     Discharge Instructions      Your x-ray did not show any evidence of a fracture in your shoulder or your humerus.  I do believe you have bruised your shoulder significantly.  Apply ice to your shoulder for 20 minutes at a time, 2-3 times a day, as needed for pain and inflammation.  Use over-the-counter Tylenol and/or ibuprofen according the package instructions as needed for mild to moderate pain.  You can use the Percocet as needed for severe pain.  Be mindful this also contains Tylenol so do not take more than 4000 mg of Tylenol in total daily.  Also do not drink alcohol or drive if you take this.  Follow the shoulder range of motion exercises given your discharge instructions.  Do  these at least once daily to help prevent loss of mobility in your shoulder.  If your shoulder is not improving in the next week I recommend that you  follow-up with orthopedics.     ED Prescriptions     Medication Sig Dispense Auth. Provider   oxyCODONE-acetaminophen (PERCOCET/ROXICET) 5-325 MG tablet Take 1 tablet by mouth every 6 (six) hours as needed for severe pain (pain score 7-10). 15 tablet Becky Augusta, NP      I have reviewed the PDMP during this encounter.   Becky Augusta, NP 01/18/23 1615    Becky Augusta, NP 01/18/23 [phone]

## 2023-03-18 ENCOUNTER — Ambulatory Visit
Admission: EM | Admit: 2023-03-18 | Discharge: 2023-03-18 | Disposition: A | Discharge status: Home / Self Care | Payer: 59 | Attending: Family Medicine | Admitting: Family Medicine

## 2023-03-18 DIAGNOSIS — S46011D Strain of muscle(s) and tendon(s) of the rotator cuff of right shoulder, subsequent encounter: Secondary | ICD-10-CM

## 2023-03-18 DIAGNOSIS — M25511 Pain in right shoulder: Secondary | ICD-10-CM

## 2023-03-18 DIAGNOSIS — S46001A Unspecified injury of muscle(s) and tendon(s) of the rotator cuff of right shoulder, initial encounter: Secondary | ICD-10-CM

## 2023-03-18 DIAGNOSIS — I10 Essential (primary) hypertension: Secondary | ICD-10-CM

## 2023-03-18 MED ORDER — METHOCARBAMOL 500 MG PO TABS: 500.0000 mg | ORAL_TABLET | Freq: Three times a day (TID) | ORAL | 0 refills | Status: AC | PRN

## 2023-03-18 MED ORDER — NAPROXEN 500 MG PO TABS: 500.0000 mg | ORAL_TABLET | Freq: Two times a day (BID) | ORAL | 0 refills | Status: AC

## 2023-03-18 NOTE — ED Triage Notes (Addendum)
Shoulder pain since December. Patient fell in December. He came here and was told that it was a bruise. Patient states that shoulder is still hurting.  Went to his PCP and they told him it should have been healed. PCP didn't look into it further. Patient had an appointment with ortho but couldn't go due to being sick. Patient states that PCP told him to come here and have it looked at again and get a note for being out of work today. Right shoulder

## 2023-03-18 NOTE — ED Provider Notes (Incomplete)
 [facility]    CSN: [id number] Arrival date & time: 03/18/23  1455      History   Chief Complaint Chief Complaint  Patient presents with   Shoulder Pain    HPI  HPI Dean Dominguez is a 63 y.o. male.   Dean Dominguez presents for right shoulder pain for the past 2 months. He fell on his shoulder after chasing his dogs.  His PCP told him that his shoulder has been healed. He is having difficulty moving his shoulder. He missed his appointmnet with an orthopedic doctor.  His job told him to go get a note as he was not bale to make it work today. Has been taking Naprosyn and Tylenol.  He is not able to lift his arm up without pain.  Pain has gotten a little better but he still hurts a lot.      Past Medical History:  Diagnosis Date   Arthritis    hands & knees   Depression    Dyspnea    DOE (pt denies 02/16/18)   Hx MRSA infection    Hypertension    Tobacco dependence     Patient Active Problem List   Diagnosis Date Noted   Duodenal ulcer 07/24/2022   Hematemesis 07/23/2022   Melena 07/23/2022   History of peptic ulcer 07/23/2022   NSAID long-term use 07/23/2022   ABLA (acute blood loss anemia) 07/23/2022   Alcohol use disorder, moderate, dependence (HCC) 07/23/2022   Hypotension 07/23/2022   Chronic pain of both knees 05/26/2022   Tobacco use disorder 05/03/2020   Left-sided Bell's palsy 03/23/2020   Cholecystitis, acute 01/19/2020   Essential hypertension 08/24/2019   Nontraumatic tear of right rotator cuff 03/03/2018   Hyperlipidemia 02/08/2014   Anxiety 09/16/2013   Depression 09/16/2013   Seasonal allergies 09/16/2013    Past Surgical History:  Procedure Laterality Date   ANTERIOR CRUCIATE LIGAMENT REPAIR Right 1999   BIOPSY  07/24/2022   Procedure: BIOPSY;  Surgeon: Jaynie Saintjean, DO;  Location: [facility] ENDOSCOPY;  Service: Gastroenterology;;   CATARACT EXTRACTION W/PHACO Left 04/29/2017   Procedure: CATARACT EXTRACTION PHACO AND  INTRAOCULAR LENS PLACEMENT (IOC);  Surgeon: Galen Manila, MD;  Location: [facility] ORS;  Service: Ophthalmology;  Laterality: Left;  [place] 00:34 AP% 17.6 CDE 6.09 Fluid pak lot # [id number]   CATARACT EXTRACTION W/PHACO Right 05/19/2017   Procedure: CATARACT EXTRACTION PHACO AND INTRAOCULAR LENS PLACEMENT (IOC);  Surgeon: Galen Manila, MD;  Location: [facility] ORS;  Service: Ophthalmology;  Laterality: Right;  [place] 00:30 AP% 14.6 CDE 4.51 Fluid pack lot # [id number]   ESOPHAGOGASTRODUODENOSCOPY (EGD) WITH PROPOFOL N/A 07/24/2022   Procedure: ESOPHAGOGASTRODUODENOSCOPY (EGD) WITH PROPOFOL;  Surgeon: Jaynie Pischke, DO;  Location: [facility] ENDOSCOPY;  Service: Gastroenterology;  Laterality: N/A;   IR EXCHANGE BILIARY DRAIN  01/26/2020   METAL PLATES  left foot Left 2018   SHOULDER ARTHROSCOPY Left 02/18/2018   Procedure: ARTHROSCOPY SHOULDER WITH EXTENSIVE DEBRIDEMENT, ROTATOR CUFF REPAIR AND SUBACROMIAL DECOMPRESSION;  Surgeon: Signa Kell, MD;  Location: [facility];  Service: Orthopedics;  Laterality: Left;  ARTHREX SWIVELOCK 4.75 MM  OTHER ARTHROCARE WAND BARROL BURR OPEN SHID [employer]OL[employer]ALL HOMANS COBBS   TENDON REPAIR Left 01/09/2017   Procedure: TENDON REPAIR-TIBIALIS TENDON;  Surgeon: Gwyneth Revels, DPM;  Location: [facility] ORS;  Service: Podiatry;  Laterality: Left;   TONSILLECTOMY     WISDOM TOOTH EXTRACTION  1978   2 wisdom teeth extracted       Home Medications  Prior to Admission medications   Medication Sig Start Date End Date Taking? Authorizing Provider  FLUoxetine (PROZAC) 40 MG capsule Take 40 mg by mouth daily. 05/26/22  Yes [provider]  hydrOXYzine (VISTARIL) 50 MG capsule Take 1 capsule (50 mg total) by mouth 3 (three) times daily as needed. 08/22/21  Yes Varney Daily, PA  lamoTRIgine (LAMICTAL XR) 100 MG 24 hour tablet Take by mouth. 03/05/23 03/04/24 Yes [provider]  losartan (COZAAR) 50 MG tablet Take 1 tablet by mouth daily. 05/26/22  Yes  [provider]  QUEtiapine (SEROQUEL) 25 MG tablet Take 1 tablet by mouth at bedtime. 02/19/23  Yes [provider]  FLUoxetine (PROZAC) 20 MG capsule Take 20 mg by mouth daily.    [provider]  fluticasone (FLONASE) 50 MCG/ACT nasal spray Place 1 spray into both nostrils daily.    [provider]  oxyCODONE-acetaminophen (PERCOCET/ROXICET) 5-325 MG tablet Take 1 tablet by mouth every 6 (six) hours as needed for severe pain (pain score 7-10). 01/18/23   Becky Augusta, NP  pantoprazole (PROTONIX) 40 MG tablet Take 1 tablet (40 mg total) by mouth 2 (two) times daily. 07/24/22 08/23/22  Lurene Shadow, MD    Family History Family History  Problem Relation Age of Onset   Hypertension Mother    Healthy Father     Social History Social History   Tobacco Use   Smoking status: Every Day    Current packs/day: 1.00    Average packs/day: 1 pack/day for 40.0 years (40.0 ttl pk-yrs)    Types: Cigarettes   Smokeless tobacco: Former    Quit date: 02/03/1997  Vaping Use   Vaping status: Never Used  Substance Use Topics   Alcohol use: Yes    Alcohol/week: 6.0 standard drinks of alcohol    Types: 6 Cans of beer per week    Comment: drank Mon 07/21/2022   Drug use: Yes    Frequency: 2.0 times per week    Types: Marijuana    Comment: 07/21/2022     Allergies   Patient has no known allergies.   Review of Systems Review of Systems: :negative unless otherwise stated in HPI.      Physical Exam Triage Vital Signs ED Triage Vitals [03/18/23 1541]  Encounter Vitals Group     BP (!) 175/99     Systolic BP Percentile      Diastolic BP Percentile      Pulse Rate 74     Resp 16     Temp 98.5 F (36.9 C)     Temp Source Oral     SpO2 96 %     Weight      Height      Head Circumference      Peak Flow      Pain Score 8     Pain Loc      Pain Education      Exclude from Growth Chart    No data found.  Updated Vital Signs BP (!) 175/99 (BP  Location: Left Arm)   Pulse 74   Temp 98.5 F (36.9 C) (Oral)   Resp 16   SpO2 96%   Visual Acuity Right Eye Distance:   Left Eye Distance:   Bilateral Distance:    Right Eye Near:   Left Eye Near:    Bilateral Near:     Physical Exam GEN: well appearing male in no acute distress  CVS: well perfused  RESP: speaking in  full sentences without pause, no respiratory distress  MSK:   Right shoulder:  No evidence of bony deformity + tenderness over long head of biceps (bicipital groove).  + TTP at [facility] joint.  Limited ROM especially in flexion, abduction Strength 5/5 grip, elbow .  Special Tests: Hawkins: positive; Empty Can: positive, Neer's: Unable to perform  Sensation intact. Peripheral pulses intact.   UC Treatments / Results  Labs (all labs ordered are listed, but only abnormal results are displayed) Labs Reviewed - No data to display  EKG   Radiology No results found.   Procedures Procedures (including critical care time)  Medications Ordered in UC Medications - No data to display  Initial Impression / Assessment and Plan / UC Course  I have reviewed the triage vital signs and the nursing notes.  Pertinent labs & imaging results that were available during my care of the patient were reviewed by me and considered in my medical decision making (see chart for details).      Pt is a 63 y.o.  male with 2 months of right  shoulder pain after ***.   On exam, pt has tenderness at *** concerning for ***.   Per chart review, his has had a traumatic complete tear of left rotator cuff and nontraumatic right rotator cuff. Follwos with Dr Lenard Forth at [facility] for othropedic surgery.     Obtained *** shoulder plain films.  Personally interpreted by me were ***unremarkable for fracture or dislocation. Radiologist report reviewed and additionally notes *** no soft tissue swelling.  Given ***Toradol IM/sling/brace/crutches  Patient to gradually return to normal activities,  as tolerated and continue ordinary activities within the limits permitted by pain. Prescribed Naproxen sodium *** and muscle relaxer *** for pain relief.  Tylenol PRN. Advised patient to avoid OTC NSAIDs while taking prescription NSAID. Counseled patient on red flag symptoms and when to seek immediate care.  ***No red flags such as progressive major motor weakness.   Patient to follow up with orthopedic provider, if symptoms do not improve with conservative treatment.  Return and ED precautions given. Understanding voiced. Discussed MDM, treatment plan and plan for follow-up with patient/parent who agrees with plan.   Final Clinical Impressions(s) / UC Diagnoses   Final diagnoses:  None   Discharge Instructions   None    ED Prescriptions   None    PDMP not reviewed this encounter.

## 2023-03-18 NOTE — Discharge Instructions (Addendum)
If medication was prescribed, stop by the pharmacy to pick up your prescriptions.  For your  pain, Take 1500 mg Tylenol twice a day, take muscle relaxer (Robaxin) twice a day, take Naprosyn twice a day,  as needed for pain. Rest and elevate the affected painful area.  Apply cold compresses intermittently, as needed.  As pain recedes, begin normal activities slowly as tolerated.  Follow up with your orthopedic provider, if symptoms persist.  Watch for worsening symptoms such as an increasing weakness or loss of sensation, increasing pain and/or the loss of bladder or bowel function. Should any of these occur, go to the emergency department immediately.

## 2023-04-03 ENCOUNTER — Other Ambulatory Visit: Payer: Self-pay | Admitting: Orthopedic Surgery

## 2023-04-03 DIAGNOSIS — S46001A Unspecified injury of muscle(s) and tendon(s) of the rotator cuff of right shoulder, initial encounter: Secondary | ICD-10-CM

## 2023-04-03 DIAGNOSIS — Z9889 Other specified postprocedural states: Secondary | ICD-10-CM

## 2023-04-15 ENCOUNTER — Other Ambulatory Visit

## 2023-04-16 ENCOUNTER — Ambulatory Visit
Admission: RE | Admit: 2023-04-16 | Discharge: 2023-04-16 | Disposition: A | Discharge status: Home / Self Care | Source: Ambulatory Visit | Attending: Orthopedic Surgery | Admitting: Orthopedic Surgery

## 2023-04-16 DIAGNOSIS — Z9889 Other specified postprocedural states: Secondary | ICD-10-CM

## 2023-04-16 DIAGNOSIS — S46001A Unspecified injury of muscle(s) and tendon(s) of the rotator cuff of right shoulder, initial encounter: Secondary | ICD-10-CM
